# Patient Record
Sex: Female | Born: 1975 | Race: White | Hispanic: No | Smoking: Former smoker
Health system: Southern US, Community
[De-identification: ages and names within clinical notes are randomized; demographics above are authoritative.]

## PROBLEM LIST (undated history)

## (undated) DIAGNOSIS — F1910 Other psychoactive substance abuse, uncomplicated: Secondary | ICD-10-CM

## (undated) DIAGNOSIS — F419 Anxiety disorder, unspecified: Secondary | ICD-10-CM

## (undated) DIAGNOSIS — F32A Depression, unspecified: Secondary | ICD-10-CM

## (undated) DIAGNOSIS — K760 Fatty (change of) liver, not elsewhere classified: Secondary | ICD-10-CM

## (undated) DIAGNOSIS — F329 Major depressive disorder, single episode, unspecified: Secondary | ICD-10-CM

## (undated) DIAGNOSIS — B977 Papillomavirus as the cause of diseases classified elsewhere: Secondary | ICD-10-CM

## (undated) DIAGNOSIS — E7800 Pure hypercholesterolemia, unspecified: Secondary | ICD-10-CM

## (undated) DIAGNOSIS — R0602 Shortness of breath: Secondary | ICD-10-CM

## (undated) DIAGNOSIS — B019 Varicella without complication: Secondary | ICD-10-CM

## (undated) DIAGNOSIS — F1010 Alcohol abuse, uncomplicated: Secondary | ICD-10-CM

## (undated) DIAGNOSIS — K219 Gastro-esophageal reflux disease without esophagitis: Secondary | ICD-10-CM

## (undated) HISTORY — PX: FRACTURE SURGERY: SHX138

## (undated) HISTORY — DX: Fatty (change of) liver, not elsewhere classified: K76.0

## (undated) HISTORY — DX: Other psychoactive substance abuse, uncomplicated: F19.10

## (undated) HISTORY — PX: OTHER SURGICAL HISTORY: SHX169

## (undated) HISTORY — DX: Alcohol abuse, uncomplicated: F10.10

## (undated) HISTORY — DX: Shortness of breath: R06.02

## (undated) HISTORY — PX: CLAVICLE SURGERY: SHX598

## (undated) HISTORY — DX: Anxiety disorder, unspecified: F41.9

## (undated) HISTORY — DX: Depression, unspecified: F32.A

## (undated) HISTORY — PX: GYNECOLOGIC CRYOSURGERY: SHX857

## (undated) HISTORY — DX: Gastro-esophageal reflux disease without esophagitis: K21.9

## (undated) HISTORY — DX: Papillomavirus as the cause of diseases classified elsewhere: B97.7

## (undated) HISTORY — DX: Varicella without complication: B01.9

## (undated) HISTORY — DX: Pure hypercholesterolemia, unspecified: E78.00

---
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
## 2017-01-18 DIAGNOSIS — Z789 Other specified health status: Secondary | ICD-10-CM | POA: Diagnosis not present

## 2017-01-18 DIAGNOSIS — F3181 Bipolar II disorder: Secondary | ICD-10-CM | POA: Diagnosis not present

## 2017-01-19 DIAGNOSIS — N390 Urinary tract infection, site not specified: Secondary | ICD-10-CM | POA: Diagnosis not present

## 2017-02-20 DIAGNOSIS — S99912A Unspecified injury of left ankle, initial encounter: Secondary | ICD-10-CM | POA: Diagnosis not present

## 2017-02-22 DIAGNOSIS — M79672 Pain in left foot: Secondary | ICD-10-CM | POA: Diagnosis not present

## 2017-06-13 DIAGNOSIS — F319 Bipolar disorder, unspecified: Secondary | ICD-10-CM | POA: Diagnosis not present

## 2017-09-20 DIAGNOSIS — M9902 Segmental and somatic dysfunction of thoracic region: Secondary | ICD-10-CM | POA: Diagnosis not present

## 2017-09-20 DIAGNOSIS — M546 Pain in thoracic spine: Secondary | ICD-10-CM | POA: Diagnosis not present

## 2017-09-20 DIAGNOSIS — M542 Cervicalgia: Secondary | ICD-10-CM | POA: Diagnosis not present

## 2017-09-20 DIAGNOSIS — M9901 Segmental and somatic dysfunction of cervical region: Secondary | ICD-10-CM | POA: Diagnosis not present

## 2017-09-22 DIAGNOSIS — M542 Cervicalgia: Secondary | ICD-10-CM | POA: Diagnosis not present

## 2017-09-22 DIAGNOSIS — M546 Pain in thoracic spine: Secondary | ICD-10-CM | POA: Diagnosis not present

## 2017-09-22 DIAGNOSIS — M9901 Segmental and somatic dysfunction of cervical region: Secondary | ICD-10-CM | POA: Diagnosis not present

## 2017-09-22 DIAGNOSIS — M9902 Segmental and somatic dysfunction of thoracic region: Secondary | ICD-10-CM | POA: Diagnosis not present

## 2018-01-03 DIAGNOSIS — F1011 Alcohol abuse, in remission: Secondary | ICD-10-CM | POA: Diagnosis not present

## 2018-01-03 DIAGNOSIS — Z87891 Personal history of nicotine dependence: Secondary | ICD-10-CM | POA: Diagnosis not present

## 2018-01-03 DIAGNOSIS — F1010 Alcohol abuse, uncomplicated: Secondary | ICD-10-CM | POA: Diagnosis not present

## 2018-01-03 DIAGNOSIS — F3181 Bipolar II disorder: Secondary | ICD-10-CM | POA: Diagnosis not present

## 2018-05-23 DIAGNOSIS — F3181 Bipolar II disorder: Secondary | ICD-10-CM | POA: Diagnosis not present

## 2018-05-23 DIAGNOSIS — F1010 Alcohol abuse, uncomplicated: Secondary | ICD-10-CM | POA: Diagnosis not present

## 2018-05-23 DIAGNOSIS — F1011 Alcohol abuse, in remission: Secondary | ICD-10-CM | POA: Diagnosis not present

## 2018-05-23 DIAGNOSIS — Z87891 Personal history of nicotine dependence: Secondary | ICD-10-CM | POA: Diagnosis not present

## 2018-05-23 DIAGNOSIS — Z79899 Other long term (current) drug therapy: Secondary | ICD-10-CM | POA: Diagnosis not present

## 2018-06-14 DIAGNOSIS — E7800 Pure hypercholesterolemia, unspecified: Secondary | ICD-10-CM

## 2018-06-14 HISTORY — DX: Pure hypercholesterolemia, unspecified: E78.00

## 2018-10-27 DIAGNOSIS — F1011 Alcohol abuse, in remission: Secondary | ICD-10-CM | POA: Diagnosis not present

## 2018-10-27 DIAGNOSIS — F3181 Bipolar II disorder: Secondary | ICD-10-CM | POA: Diagnosis not present

## 2018-11-08 ENCOUNTER — Encounter: Payer: Self-pay | Admitting: Obstetrics and Gynecology

## 2018-12-13 ENCOUNTER — Encounter: Payer: Self-pay | Admitting: Obstetrics and Gynecology

## 2018-12-26 ENCOUNTER — Ambulatory Visit: Payer: Federal, State, Local not specified - PPO | Admitting: Obstetrics and Gynecology

## 2018-12-26 ENCOUNTER — Other Ambulatory Visit (HOSPITAL_COMMUNITY)
Admission: RE | Admit: 2018-12-26 | Discharge: 2018-12-26 | Disposition: A | Discharge status: Home / Self Care | Payer: Federal, State, Local not specified - PPO | Source: Ambulatory Visit | Attending: Obstetrics and Gynecology | Admitting: Obstetrics and Gynecology

## 2018-12-26 ENCOUNTER — Encounter: Payer: Self-pay | Admitting: Obstetrics and Gynecology

## 2018-12-26 ENCOUNTER — Other Ambulatory Visit: Payer: Self-pay

## 2018-12-26 VITALS — BP 118/68 | HR 80 | Temp 97.4°F | Resp 14 | Ht 73.0 in | Wt 266.8 lb

## 2018-12-26 DIAGNOSIS — Z23 Encounter for immunization: Secondary | ICD-10-CM

## 2018-12-26 DIAGNOSIS — F419 Anxiety disorder, unspecified: Secondary | ICD-10-CM | POA: Diagnosis not present

## 2018-12-26 DIAGNOSIS — F329 Major depressive disorder, single episode, unspecified: Secondary | ICD-10-CM | POA: Diagnosis not present

## 2018-12-26 DIAGNOSIS — F32A Depression, unspecified: Secondary | ICD-10-CM

## 2018-12-26 DIAGNOSIS — Z01419 Encounter for gynecological examination (general) (routine) without abnormal findings: Secondary | ICD-10-CM

## 2018-12-26 NOTE — Patient Instructions (Signed)

## 2018-12-26 NOTE — Progress Notes (Signed)
43 y.o. G5P0001 Single Caucasian female here as a new patient for an annual exam. Patient moved here from [place], [place]    Has spotting occasionally with her Mirena IUD.   Has a skin lump on her left thigh.   Gained about 40 pounds in the last year.   Lots of transition moving to [place] in the last year. Needs a psychiatrist in town here.    Works for the [employer].  Son is 13 yo.   PCP: Dr. Kittie Plater -- [facility]    Patient's last menstrual period was 10/27/2018 (within weeks).           Sexually active: Yes.    The current method of family planning is Mirena IUD inserted summer 2017.    Exercising: Yes.    walking and yoga Smoker:  Former, quit 2010  Health Maintenance: Pap:  08/05/15 negative History of abnormal Pap:  Yes, hx of Cryosurgery MMG:  03/03/16 BIRADS 1 negative TDaP:  01/04/08 Gardasil:   no HIV: negative in pregnancy Hep C: donated blood Screening Labs: discuss if needed   reports that she quit smoking about 10 years ago. Her smoking use included cigarettes. She has never used smokeless tobacco. She reports previous alcohol use. She reports that she does not use drugs.  Past Medical History:  Diagnosis Date  . Anxiety   . Depression   . HPV in female   . Substance abuse [facility])     Past Surgical History:  Procedure Laterality Date  . CLAVICLE SURGERY    . GYNECOLOGIC CRYOSURGERY      Current Outpatient Medications  Medication Sig Dispense Refill  . DULoxetine (CYMBALTA) 30 MG capsule Take 1 capsule by mouth daily.    Marland Kitchen lamoTRIgine (LAMICTAL) 150 MG tablet Take 150 mg by mouth daily.     Marland Kitchen levonorgestrel (MIRENA) 20 MCG/24HR IUD by Intrauterine route.     No current facility-administered medications for this visit.     Family History  Problem Relation Age of Onset  . Hypertension Mother   . Lupus Maternal Grandmother   . Colon cancer Paternal Grandfather     Review of Systems  Constitutional: Negative.   HENT: Negative.   Eyes:  Negative.   Respiratory: Negative.   Cardiovascular: Negative.   Gastrointestinal: Negative.   Endocrine: Negative.   Genitourinary: Negative.   Musculoskeletal: Negative.   Skin: Negative.   Allergic/Immunologic: Negative.   Neurological: Negative.   Hematological: Negative.   Psychiatric/Behavioral: Negative.     Exam:   BP 118/68 (BP Location: Right Arm, Patient Position: Sitting, Cuff Size: Large)   Pulse 80   Temp (!) 97.4 F (36.3 C) (Temporal)   Resp 14   Ht 6\' 1"  (1.854 m)   Wt 266 lb 12.8 oz (121 kg)   LMP 10/27/2018 (Within Weeks)   BMI 35.20 kg/m     General appearance: alert, cooperative and appears stated age Head: normocephalic, without obvious abnormality, atraumatic Neck: no adenopathy, supple, symmetrical, trachea midline and thyroid normal to inspection and palpation Lungs: clear to auscultation bilaterally Breasts: normal appearance, no masses or tenderness, No nipple retraction or dimpling, No nipple discharge or bleeding, No axillary adenopathy Heart: regular rate and rhythm Abdomen: soft, non-tender; no masses, no organomegaly Extremities: extremities normal, atraumatic, no cyanosis or edema Skin: skin color, texture, turgor normal. No rashes or lesions Lymph nodes: cervical, supraclavicular, and axillary nodes normal. Neurologic: grossly normal  Pelvic: External genitalia:  Sebaceous cyst of the left medial thigh, slight  redness.               No abnormal inguinal nodes palpated.              Urethra:  normal appearing urethra with no masses, tenderness or lesions              Bartholins and Skenes: normal                 Vagina: normal appearing vagina with normal color and discharge, no lesions              Cervix: no lesions.  IUD string noted and are short.               Pap taken: Yes.   Bimanual Exam:  Uterus:  normal size, contour, position, consistency, mobility, non-tender              Adnexa: no mass, fullness, tenderness               Rectal exam: Yes.  .  Confirms.              Anus:  normal sphincter tone, no lesions  Chaperone was present for exam.  Assessment:   Well woman visit with normal exam. Hx cryotherapy.  Mirena IUD.  Depression and anxiety.  Weight gain.   Plan: Mammogram screening discussed.  Information for facilities in [place] to patient.  Self breast awareness reviewed. Pap and HR HPV as above. Guidelines for Calcium, Vitamin D, regular exercise program including cardiovascular and weight bearing exercise. TDap.  Gardasil.  Initiate series.  Routine labs and TSH.  Referral to psychiatry.  Follow up annually and prn.   After visit summary provided.

## 2018-12-27 LAB — COMPREHENSIVE METABOLIC PANEL
ALT: 19 IU/L (ref 0–32)
AST: 17 IU/L (ref 0–40)
Albumin/Globulin Ratio: 1.8 (ref 1.2–2.2)
Albumin: 4.4 g/dL (ref 3.8–4.8)
Alkaline Phosphatase: 104 IU/L (ref 39–117)
BUN/Creatinine Ratio: 14 (ref 9–23)
BUN: 10 mg/dL (ref 6–24)
Bilirubin Total: 1 mg/dL (ref 0.0–1.2)
CO2: 23 mmol/L (ref 20–29)
Calcium: 9.6 mg/dL (ref 8.7–10.2)
Chloride: 101 mmol/L (ref 96–106)
Creatinine, Ser: 0.73 mg/dL (ref 0.57–1.00)
GFR calc Af Amer: 117 mL/min/{1.73_m2} (ref >59)
GFR calc non Af Amer: 101 mL/min/{1.73_m2} (ref >59)
Globulin, Total: 2.5 g/dL (ref 1.5–4.5)
Glucose: 94 mg/dL (ref 65–99)
Potassium: 4.6 mmol/L (ref 3.5–5.2)
Sodium: 138 mmol/L (ref 134–144)
Total Protein: 6.9 g/dL (ref 6.0–8.5)

## 2018-12-27 LAB — CBC
Hematocrit: 38.6 % (ref 34.0–46.6)
Hemoglobin: 13 g/dL (ref 11.1–15.9)
MCH: 29.3 pg (ref 26.6–33.0)
MCHC: 33.7 g/dL (ref 31.5–35.7)
MCV: 87 fL (ref 79–97)
Platelets: 337 10*3/uL (ref 150–450)
RBC: 4.44 x10E6/uL (ref 3.77–5.28)
RDW: 11.7 % (ref 11.7–15.4)
WBC: 8.2 10*3/uL (ref 3.4–10.8)

## 2018-12-27 LAB — LIPID PANEL
Chol/HDL Ratio: 4.6 ratio — ABNORMAL HIGH (ref 0.0–4.4)
Cholesterol, Total: 251 mg/dL — ABNORMAL HIGH (ref 100–199)
HDL: 54 mg/dL (ref >39)
LDL Calculated: 169 mg/dL — ABNORMAL HIGH (ref 0–99)
Triglycerides: 138 mg/dL (ref 0–149)
VLDL Cholesterol Cal: 28 mg/dL (ref 5–40)

## 2018-12-27 LAB — TSH: TSH: 2.41 u[IU]/mL (ref 0.450–4.500)

## 2018-12-28 LAB — CYTOLOGY - PAP
Diagnosis: UNDETERMINED — AB
HPV: DETECTED — AB

## 2018-12-31 ENCOUNTER — Encounter: Payer: Self-pay | Admitting: Obstetrics and Gynecology

## 2019-01-01 ENCOUNTER — Telehealth: Payer: Self-pay

## 2019-01-01 DIAGNOSIS — R87610 Atypical squamous cells of undetermined significance on cytologic smear of cervix (ASC-US): Secondary | ICD-10-CM

## 2019-01-01 NOTE — Telephone Encounter (Signed)
-----   Message from Nunzio Cobbs, MD sent at 12/31/2018  3:57 PM EDT ----- Please contact patient with results.  Her pap is showing ASCUS, and she tested positive for HR HPV.  I am recommending a colposcopy with me.   Your cholesterol is elevated, and I recommend she adopt a diet low in saturated fats and cholesterol, and make an appointment to see a PCP.  Her blood counts, blood chemistries, and thyroid testing are all normal.

## 2019-01-01 NOTE — Telephone Encounter (Signed)
Left message to call Dniya Neuhaus, CMA. °

## 2019-01-02 NOTE — Telephone Encounter (Signed)
Patient is returning a call to Jill. °

## 2019-01-02 NOTE — Telephone Encounter (Signed)
Spoke with patient, advised as seen below per Dr. Quincy Simmonds. Brief explanation of colpo provided, questions answered. IUD for contraceptive, does not have menses. Colpo scheduled for 8/4 at 9:30am with Dr. Quincy Simmonds. Order placed for precert. [id number] precautions reviewed. Patient will return call once she has established care with PCP to request copy of labs.  Patient verbalizes understanding and is agreeable.   Routing to provider for final review. Patient is agreeable to disposition. Will close encounter.  Cc: Magdalene Patricia, [address] [employer]

## 2019-01-02 NOTE — Telephone Encounter (Signed)
Left message to call Jenet Durio, RN at [facility] [phone].   

## 2019-01-02 NOTE — Telephone Encounter (Signed)
Patient returned call

## 2019-01-03 ENCOUNTER — Telehealth: Payer: Self-pay | Admitting: Obstetrics and Gynecology

## 2019-01-03 NOTE — Telephone Encounter (Signed)
Call placed to convey benefits for colposcopy. Spoke with patient she understands/agreeable with benefits. Patient is aware of the cancellation policy. Appointment scheduled 01/16/19.

## 2019-01-12 NOTE — Progress Notes (Signed)
  Subjective:     Patient ID: Kristina Chambers, female   DOB: 1975/07/28, 43 y.o.   MRN: [id number]  HPI   Patient here today for colposcopy with pap smear 12-26-18 showing ASCUS:Pos.HR HPV.  Hx cryotherapy 20 years ago.   Had her first Gardasil.   Review of Systems  Constitutional: Negative.   HENT: Negative.   Eyes: Negative.   Respiratory: Negative.   Cardiovascular: Negative.   Gastrointestinal: Negative.   Endocrine: Negative.   Genitourinary: Negative.   Musculoskeletal: Negative.   Skin: Negative.   Allergic/Immunologic: Negative.   Neurological: Negative.   Hematological: Negative.   Psychiatric/Behavioral: Negative.     LMP: spotting 12-28-2018 Contraception: Mirena IUD 11/2015 UPT: negative      Objective:   Physical Exam Genitourinary:      Colposcopy - cervix, vagina. Consent for procedure.  3% acetic acid used in vagina and cervix White light and green light filter used.  Colposcopy satisfactory:  Yes   __x___          No    _____ Findings:    IUD strings short and present.  Cervix:  Acetowhite change at 3:00. Vagina:  No lesions.  Biopsies:   ECC and biopsy at 3:00 Monsel's placed.  Minimal EBL. No complications.      Assessment:     ASCUS pap and positive HR HPV.  Hx cryotherapy.  Vaginal sidewalls collapsing in during colposcopy.  Mirena IUD with strings short.     Plan:     FU biopsy results.  Instructions and precautions given.  Complete Gardasil tx.  We discussed HPV, abnormal paps, Gardasil, and LEEP.  If needs treatment, will need insulated Graves speculum.

## 2019-01-16 ENCOUNTER — Other Ambulatory Visit: Payer: Self-pay

## 2019-01-16 ENCOUNTER — Ambulatory Visit (INDEPENDENT_AMBULATORY_CARE_PROVIDER_SITE_OTHER): Payer: Federal, State, Local not specified - PPO | Admitting: Obstetrics and Gynecology

## 2019-01-16 ENCOUNTER — Encounter: Payer: Self-pay | Admitting: Obstetrics and Gynecology

## 2019-01-16 VITALS — BP 124/76 | HR 70 | Temp 97.3°F | Resp 14 | Ht 73.0 in | Wt 266.5 lb

## 2019-01-16 DIAGNOSIS — R87610 Atypical squamous cells of undetermined significance on cytologic smear of cervix (ASC-US): Secondary | ICD-10-CM | POA: Diagnosis not present

## 2019-01-16 DIAGNOSIS — Z01812 Encounter for preprocedural laboratory examination: Secondary | ICD-10-CM

## 2019-01-16 DIAGNOSIS — N870 Mild cervical dysplasia: Secondary | ICD-10-CM | POA: Diagnosis not present

## 2019-01-16 DIAGNOSIS — R87810 Cervical high risk human papillomavirus (HPV) DNA test positive: Secondary | ICD-10-CM | POA: Diagnosis not present

## 2019-01-16 LAB — POCT URINE PREGNANCY: Preg Test, Ur: NEGATIVE

## 2019-01-18 ENCOUNTER — Telehealth: Payer: Self-pay

## 2019-01-18 NOTE — Telephone Encounter (Signed)
Spoke with patient and notified of pap results per Dr.Silva's note below. 08 recall entered. AEX 01-23-20

## 2019-01-18 NOTE — Telephone Encounter (Signed)
Left message to call Christerpher Clos, CMA. °

## 2019-01-18 NOTE — Telephone Encounter (Signed)
-----   Message from Nunzio Cobbs, MD sent at 01/18/2019  9:39 AM EDT ----- Please report results of colposcopy showing LGSIL of the cervix.  These are low grade abnormal cells but do not require any treatment.  She needs a pap and HR HPV in one year.  Pap recall - 08.  I am highlighting this result so you know to contact the patient.

## 2019-02-21 ENCOUNTER — Encounter: Payer: Self-pay | Admitting: Adult Health

## 2019-02-21 ENCOUNTER — Other Ambulatory Visit: Payer: Self-pay

## 2019-02-21 ENCOUNTER — Ambulatory Visit (INDEPENDENT_AMBULATORY_CARE_PROVIDER_SITE_OTHER): Payer: Federal, State, Local not specified - PPO | Admitting: Adult Health

## 2019-02-21 DIAGNOSIS — F411 Generalized anxiety disorder: Secondary | ICD-10-CM | POA: Diagnosis not present

## 2019-02-21 DIAGNOSIS — F339 Major depressive disorder, recurrent, unspecified: Secondary | ICD-10-CM

## 2019-02-21 DIAGNOSIS — F331 Major depressive disorder, recurrent, moderate: Secondary | ICD-10-CM

## 2019-02-21 DIAGNOSIS — G4700 Insomnia, unspecified: Secondary | ICD-10-CM | POA: Diagnosis not present

## 2019-02-21 DIAGNOSIS — F329 Major depressive disorder, single episode, unspecified: Secondary | ICD-10-CM | POA: Insufficient documentation

## 2019-02-21 MED ORDER — ARIPIPRAZOLE 5 MG PO TABS: ORAL_TABLET | ORAL | 2 refills | Status: DC

## 2019-02-21 MED ORDER — DULOXETINE HCL 30 MG PO CPEP: 30.0000 mg | ORAL_CAPSULE | Freq: Every day | ORAL | 1 refills | Status: DC

## 2019-02-21 MED ORDER — LAMOTRIGINE 150 MG PO TABS: 150.0000 mg | ORAL_TABLET | Freq: Every day | ORAL | 1 refills | Status: DC

## 2019-02-21 NOTE — Progress Notes (Signed)
[facility] MD/PA/NP Initial Note  02/21/2019 1:59 PM Kristina Chambers  MRN:  [id number]  Chief Complaint:   HPI:   Describes mood today as "not so good". Pleasant. Mood symptoms - reports depression, anxiety, and irritability. More depressed overall. Stating "I'm not doing too good". Symtoms present and worsening over the past year - "even before Covid-19". Has been working from home since Covid-19. Out of her normal work routine - stating "I do better with structure". Has been in the bed for the last week. Was at 100 plus days of sobriety up until a few weeks ago. Has been drinking over the last 2 weeks. Consumes 4 bottles of a wine a day or half of a fifth of ETOH. Last use was yesterday - drank four bottles yesterday. Had suicidal thoughts last weekend and called out to friends, family, and co-workers. Has had the support she needs and is no longer feeling suicidal. Stating "I just want to do what I need to do to get better". Received treatment at an outpatient center in 2018. Has had "periods of doing well", but has declined over the past year. Decreased interest and motivation. Taking medications as prescribed, but does not feel like they are helping. Would like to start talk therapy as soon as possible.  Energy levels stable.  Active, does not have a regular exercise routine. Works full-time from home. 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  Enjoys some usual interests and activities. Spending time with family - 66 year old son. Currently home-schooling son. Has a boyfriend in [place].  Appetite increased. Weight gain 50 pounds over past year. Sleeps better some nights than others. Averages 6 hours of broken sleep. Napping during the day - about 2 hours. Mind racing at night - tries to distract with tv, reading, etc. Stating "I have to do different things to get to sleep". Alcohol gives her 3 hours of sleep.  Focus and concentration difficulties. Completing tasks. Managing some aspects of  household. Stating "my house is either messy or immaculate. Denies SI or HI. Denies AH or VH.  Visit Diagnosis:    ICD-10-CM   1. Generalized anxiety disorder  F41.1 DULoxetine (CYMBALTA) 30 MG capsule    lamoTRIgine (LAMICTAL) 150 MG tablet    ARIPiprazole (ABILIFY) 5 MG tablet  2. Major depressive disorder, recurrent episode, moderate (HCC)  F33.1 DULoxetine (CYMBALTA) 30 MG capsule    lamoTRIgine (LAMICTAL) 150 MG tablet    ARIPiprazole (ABILIFY) 5 MG tablet    Past Psychiatric History: Denies inpatient treatment. Had Outpatient in [place] Fall 2006. Has seen therapist in the past.    Past Medical History:  Past Medical History:  Diagnosis Date  . Anxiety   . Depression   . Elevated cholesterol 2020  . HPV in female   . Substance abuse [facility])     Past Surgical History:  Procedure Laterality Date  . CLAVICLE SURGERY    . GYNECOLOGIC CRYOSURGERY      Family Psychiatric History: Mother - Depression   Family History:  Family History  Problem Relation Age of Onset  . Hypertension Mother   . Lupus Maternal Grandmother   . Colon cancer Paternal Grandfather     Social History:  Social History   Socioeconomic History  . Marital status: Single    Spouse name: Not on file  . Number of children: Not on file  . Years of education: Not on file  . Highest education level: Not on file  Occupational History  .  Not on file  Social Needs  . Financial resource strain: Not on file  . Food insecurity    Worry: Not on file    Inability: Not on file  . Transportation needs    Medical: Not on file    Non-medical: Not on file  Tobacco Use  . Smoking status: Former Smoker    Types: Cigarettes    Quit date: 06/14/2008    Years since quitting: 10.6  . Smokeless tobacco: Never Used  Substance and Sexual Activity  . Alcohol use: Not Currently  . Drug use: Never  . Sexual activity: Yes    Birth control/protection: I.U.D.    Comment: Mirena insertion summer of 2017  Lifestyle   . Physical activity    Days per week: Not on file    Minutes per session: Not on file  . Stress: Not on file  Relationships  . Social Herbalist on phone: Not on file    Gets together: Not on file    Attends religious service: Not on file    Active member of club or organization: Not on file    Attends meetings of clubs or organizations: Not on file    Relationship status: Not on file  Other Topics Concern  . Not on file  Social History Narrative  . Not on file    Allergies: No Known Allergies  Metabolic Disorder Labs: No results found for: HGBA1C, MPG No results found for: PROLACTIN Lab Results  Component Value Date   CHOL 251 (H) 12/26/2018   TRIG 138 12/26/2018   HDL 54 12/26/2018   CHOLHDL 4.6 (H) 12/26/2018   LDLCALC 169 (H) 12/26/2018   Lab Results  Component Value Date   TSH 2.410 12/26/2018    Therapeutic Level Labs: No results found for: LITHIUM No results found for: VALPROATE No components found for:  CBMZ  Current Medications: Current Outpatient Medications  Medication Sig Dispense Refill  . ARIPiprazole (ABILIFY) 5 MG tablet Take 1/2 tablet at bedtime for 3 nights, then one tablet at bedtime. 30 tablet 2  . DULoxetine (CYMBALTA) 30 MG capsule Take 1 capsule (30 mg total) by mouth daily. 90 capsule 1  . lamoTRIgine (LAMICTAL) 100 MG tablet     . lamoTRIgine (LAMICTAL) 150 MG tablet Take 1 tablet (150 mg total) by mouth daily. 90 tablet 1  . levonorgestrel (MIRENA) 20 MCG/24HR IUD by Intrauterine route.     No current facility-administered medications for this visit.     Medication Side Effects: none  Orders placed this visit:  No orders of the defined types were placed in this encounter.   Psychiatric Specialty Exam:  ROS  There were no vitals taken for this visit.There is no height or weight on file to calculate BMI.  General Appearance: UTA  Eye Contact:  UTA  Speech:  Normal Rate  Volume:  Normal  Mood:  Euthymic  Affect:   Appropriate  Thought Process:  Coherent  Orientation:  Full (Time, Place, and Person)  Thought Content: Logical   Suicidal Thoughts:  No  Homicidal Thoughts:  No  Memory:  WNL  Judgement:  Intact  Insight:  Good  Psychomotor Activity:  Normal  Concentration:  Concentration: Good  Recall:  Good  Fund of Knowledge: Good  Language: Good  Assets:  Communication Skills Desire for Improvement Financial Resources/Insurance Housing Intimacy Leisure Time Physical Health Resilience Social Support Talents/Skills Transportation Vocational/Educational  ADL's:  Intact  Cognition: WNL  Prognosis:  Good  Screenings:   Receiving Psychotherapy: No   Treatment Plan/Recommendations:   Plan:  Lamictal 150mg  daily x 2 years Cymbalta 30mg  daily x 2 years  Add - Abilify 5mg  - 1/2 tab at hs  X 3, then one tablet daily.   RTC 3 weeks  Set up with therapy.   Patient advised to contact office with any questions, adverse effects, or acute worsening in signs and symptoms.  Discussed potential metabolic side effects associated with atypical antipsychotics, as well as potential risk for movement side effects. Advised pt to contact office if movement side effects occur.   Counseled patient regarding potential benefits, risks, and side effects of Lamictal to include potential risk of Stevens-Johnson syndrome. Advised patient to stop taking Lamictal and contact office immediately if rash develops and to seek urgent medical attention if rash is severe and/or spreading quickly.    Aloha Gell, NP

## 2019-02-26 ENCOUNTER — Other Ambulatory Visit: Payer: Self-pay

## 2019-02-26 ENCOUNTER — Ambulatory Visit (INDEPENDENT_AMBULATORY_CARE_PROVIDER_SITE_OTHER): Payer: Federal, State, Local not specified - PPO

## 2019-02-26 VITALS — BP 128/74 | HR 112 | Temp 96.4°F | Ht 73.0 in | Wt 271.0 lb

## 2019-02-26 DIAGNOSIS — Z23 Encounter for immunization: Secondary | ICD-10-CM | POA: Diagnosis not present

## 2019-02-26 NOTE — Progress Notes (Signed)
Patient in today for 2nd Gardasil injection.   Contraception: IUD LMP: has some spotting with IUD Last AEX: 12/26/18 with BS  Injection given in LD. Patient tolerated shot well.   Patient informed next injection due in about 4 months.  Advised patient, if not on birth control, to return for next injection with cycle.   Routed to provider for final review.  Encounter closed.

## 2019-03-05 ENCOUNTER — Ambulatory Visit (INDEPENDENT_AMBULATORY_CARE_PROVIDER_SITE_OTHER): Payer: Federal, State, Local not specified - PPO | Admitting: Mental Health

## 2019-03-05 ENCOUNTER — Other Ambulatory Visit: Payer: Self-pay

## 2019-03-05 DIAGNOSIS — F411 Generalized anxiety disorder: Secondary | ICD-10-CM | POA: Diagnosis not present

## 2019-03-05 DIAGNOSIS — F331 Major depressive disorder, recurrent, moderate: Secondary | ICD-10-CM

## 2019-03-05 NOTE — Progress Notes (Signed)
[facility] Counselor Initial Adult Exam  Name: Kristina Chambers Date: 03/05/2019 MRN: [id number] DOB: [birth year] PCP: System, Pcp Not In  Time spent: 42 minutes   Guardian/Payee:  none  Paperwork requested:  none  Reason for Visit /Presenting Problem:  Pt copes w/ depression. Having "ups and downs" for the past year. On August 30th, texted a co-worker about having SI. 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 She stated mgmt at work is supportive. She is the care of Deloria Lair, NP at our practice and reports medication effectiveness. She relocated to this area from [place], [place] for about a year. She stated work is stressful, financial stress. She is a single parent, father of their child is not involved. Her son is age 46. She is currently at diversion investigator for the [employer] She wants to find a balance w/ work and her personal life. Her son is now in a camp and this will help give him needed structure. She is in a relationship w/ a man who lives in [place]; she is trying to mend the relationship due to some past issues. She was sober for about 5 months, then relapsed in August for about a week. Uses to cope w/ depression. She attends AA currently, on the 4th step. grM was an alcohol, mother of pt copes w/ depression.   Mental Status Exam:   Appearance:   Casual     Behavior:  Appropriate  Motor:  Normal  Speech/Language:   Clear and Coherent  Affect:  Appropriate  Mood:  normal  Thought process:  normal  Thought content:    WNL  Sensory/Perceptual disturbances:    WNL  Orientation:  oriented to person, place, time/date and situation  Attention:  Good  Concentration:  Good  Memory:  WNL  Fund of knowledge:   Good  Insight:    Good  Judgment:   Good  Impulse Control:  Good   Reported Symptoms:   Depressed mood (5/10 severity), anxiety, hopeless feelings, low motivation, problems w/ concentration  Risk Assessment: Danger to Self:   passive SI Self-injurious Behavior: No Danger  to Others: No Duty to Warn:no Physical Aggression / Violence:No  Access to Firearms a concern: No  Gang Involvement:No  Patient / guardian was educated about steps to take if suicide or homicide risk level increases between visits: yes While future psychiatric events cannot be accurately predicted, the patient does not currently require acute inpatient psychiatric care and does not currently meet [facility] involuntary commitment criteria.   Medical History/Surgical History:  Past Medical History:  Diagnosis Date  . Anxiety   . Depression   . Elevated cholesterol 2020  . HPV in female   . Substance abuse [facility])     Past Surgical History:  Procedure Laterality Date  . CLAVICLE SURGERY    . GYNECOLOGIC CRYOSURGERY      Medications: Current Outpatient Medications  Medication Sig Dispense Refill  . ARIPiprazole (ABILIFY) 5 MG tablet Take 1/2 tablet at bedtime for 3 nights, then one tablet at bedtime. 30 tablet 2  . DULoxetine (CYMBALTA) 30 MG capsule Take 1 capsule (30 mg total) by mouth daily. 90 capsule 1  . lamoTRIgine (LAMICTAL) 100 MG tablet     . lamoTRIgine (LAMICTAL) 150 MG tablet Take 1 tablet (150 mg total) by mouth daily. 90 tablet 1  . levonorgestrel (MIRENA) 20 MCG/24HR IUD by Intrauterine route.     No current facility-administered medications for this visit.     No Known Allergies  Diagnoses:    ICD-10-CM   1. Major depressive disorder, recurrent episode, moderate (HCC)  F33.1   2. Generalized anxiety disorder  F41.1     Plan of Care: complete part 2 of assessment next session.    Anson [place], [facility]

## 2019-03-12 ENCOUNTER — Ambulatory Visit (INDEPENDENT_AMBULATORY_CARE_PROVIDER_SITE_OTHER): Payer: Federal, State, Local not specified - PPO | Admitting: Adult Health

## 2019-03-12 ENCOUNTER — Encounter: Payer: Self-pay | Admitting: Adult Health

## 2019-03-12 DIAGNOSIS — G4700 Insomnia, unspecified: Secondary | ICD-10-CM

## 2019-03-12 DIAGNOSIS — F331 Major depressive disorder, recurrent, moderate: Secondary | ICD-10-CM

## 2019-03-12 DIAGNOSIS — F1010 Alcohol abuse, uncomplicated: Secondary | ICD-10-CM | POA: Diagnosis not present

## 2019-03-12 DIAGNOSIS — F411 Generalized anxiety disorder: Secondary | ICD-10-CM

## 2019-03-12 MED ORDER — NALTREXONE HCL 50 MG PO TABS: 50.0000 mg | ORAL_TABLET | Freq: Every day | ORAL | 2 refills | Status: DC

## 2019-03-12 NOTE — Progress Notes (Signed)
[facility] MD/PA/NP Initial Note  03/12/2019 3:36 PM Kristina Chambers  MRN:  [id number]  Chief Complaint:   HPI:   Describes mood today as "ok". Pleasant. Mood symptoms - denies depression, anxiety, and irritability. Stating "I'm doing much better". Could tell she was doing better in the first week and a half. Son noticed a difference. Coworkers have also noticed a difference. Denies ETOH use, but having "cravings". Stating "It is worse in the afternoons". Has taken Campral in the past, but had difficulties with compliance. Talking to her sponsor. Going to [employer]. Improved interest and motivation. Taking medications as prescribed. Seeing therapist. Energy levels improved. Active, has a regular exercise routine. Works full-time from home. 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 Enjoys some usual interests and activities. Spending time with family - 11 year old son. Currently home-schooling son. Has a boyfriend in [place].  Appetite adequate. Weight gain 50 pounds over past years.  Sleeps better at night. Has returned to a normal sleeping schedule. Getting up easier in the mornings. Feels alert.  Focus and concentration difficulties. Completing tasks. Managing some aspects of household. Work going well. Feels more productive. Denies SI or HI. Denies AH or VH.  Visit Diagnosis:    ICD-10-CM   1. Major depressive disorder, recurrent episode, moderate (HCC)  F33.1   2. Generalized anxiety disorder  F41.1   3. Insomnia, unspecified type  G47.00   4. Alcohol abuse  F10.10 naltrexone (DEPADE) 50 MG tablet    Past Psychiatric History: Denies inpatient treatment. Had Outpatient in [place] Fall 2006. Has seen therapist in the past.    Past Medical History:  Past Medical History:  Diagnosis Date  . Anxiety   . Depression   . Elevated cholesterol 2020  . HPV in female   . Substance abuse [facility])     Past Surgical History:  Procedure Laterality Date  . CLAVICLE SURGERY    . GYNECOLOGIC  CRYOSURGERY      Family Psychiatric History: Mother - Depression   Family History:  Family History  Problem Relation Age of Onset  . Hypertension Mother   . Lupus Maternal Grandmother   . Colon cancer Paternal Grandfather     Social History:  Social History   Socioeconomic History  . Marital status: Single    Spouse name: Not on file  . Number of children: Not on file  . Years of education: Not on file  . Highest education level: Not on file  Occupational History  . Not on file  Social Needs  . Financial resource strain: Not on file  . Food insecurity    Worry: Not on file    Inability: Not on file  . Transportation needs    Medical: Not on file    Non-medical: Not on file  Tobacco Use  . Smoking status: Former Smoker    Types: Cigarettes    Quit date: 06/14/2008    Years since quitting: 10.7  . Smokeless tobacco: Never Used  Substance and Sexual Activity  . Alcohol use: Not Currently  . Drug use: Never  . Sexual activity: Yes    Birth control/protection: I.U.D.    Comment: Mirena insertion summer of 2017  Lifestyle  . Physical activity    Days per week: Not on file    Minutes per session: Not on file  . Stress: Not on file  Relationships  . Social Herbalist on phone: Not on file    Gets together:  Not on file    Attends religious service: Not on file    Active member of club or organization: Not on file    Attends meetings of clubs or organizations: Not on file    Relationship status: Not on file  Other Topics Concern  . Not on file  Social History Narrative  . Not on file    Allergies: No Known Allergies  Metabolic Disorder Labs: No results found for: HGBA1C, MPG No results found for: PROLACTIN Lab Results  Component Value Date   CHOL 251 (H) 12/26/2018   TRIG 138 12/26/2018   HDL 54 12/26/2018   CHOLHDL 4.6 (H) 12/26/2018   LDLCALC 169 (H) 12/26/2018   Lab Results  Component Value Date   TSH 2.410 12/26/2018    Therapeutic  Level Labs: No results found for: LITHIUM No results found for: VALPROATE No components found for:  CBMZ  Current Medications: Current Outpatient Medications  Medication Sig Dispense Refill  . ARIPiprazole (ABILIFY) 5 MG tablet Take 1/2 tablet at bedtime for 3 nights, then one tablet at bedtime. 30 tablet 2  . DULoxetine (CYMBALTA) 30 MG capsule Take 1 capsule (30 mg total) by mouth daily. 90 capsule 1  . lamoTRIgine (LAMICTAL) 100 MG tablet     . lamoTRIgine (LAMICTAL) 150 MG tablet Take 1 tablet (150 mg total) by mouth daily. 90 tablet 1  . levonorgestrel (MIRENA) 20 MCG/24HR IUD by Intrauterine route.    . naltrexone (DEPADE) 50 MG tablet Take 1 tablet (50 mg total) by mouth daily. 30 tablet 2   No current facility-administered medications for this visit.     Medication Side Effects: none  Orders placed this visit:  No orders of the defined types were placed in this encounter.   Psychiatric Specialty Exam:  ROS  There were no vitals taken for this visit.There is no height or weight on file to calculate BMI.  General Appearance: UTA  Eye Contact:  UTA  Speech:  Normal Rate  Volume:  Normal  Mood:  Euthymic  Affect:  UTA  Thought Process:  Coherent  Orientation:  Full (Time, Place, and Person)  Thought Content: Logical   Suicidal Thoughts:  No  Homicidal Thoughts:  No  Memory:  WNL  Judgement:  Intact  Insight:  Good  Psychomotor Activity:  Normal  Concentration:  Concentration: Good  Recall:  Good  Fund of Knowledge: Good  Language: Good  Assets:  Communication Skills Desire for Improvement Financial Resources/Insurance Housing Intimacy Leisure Time Physical Health Resilience Social Support Talents/Skills Transportation Vocational/Educational  ADL's:  Intact  Cognition: WNL  Prognosis:  Good   Screenings:   Receiving Psychotherapy: No   Treatment Plan/Recommendations:   Plan:  Continue: Lamictal 150mg  daily  Cymbalta 30mg  daily  Abilify 5mg   daily Add Naltrexone 50mg  daily for ETOH cravings  RTC 4 weeks  Set up with therapy.   Patient advised to contact office with any questions, adverse effects, or acute worsening in signs and symptoms.  Discussed potential metabolic side effects associated with atypical antipsychotics, as well as potential risk for movement side effects. Advised pt to contact office if movement side effects occur.   Counseled patient regarding potential benefits, risks, and side effects of Lamictal to include potential risk of Stevens-Johnson syndrome. Advised patient to stop taking Lamictal and contact office immediately if rash develops and to seek urgent medical attention if rash is severe and/or spreading quickly.    Aloha Gell, NP

## 2019-03-13 ENCOUNTER — Other Ambulatory Visit: Payer: Self-pay | Admitting: Adult Health

## 2019-03-13 DIAGNOSIS — F1010 Alcohol abuse, uncomplicated: Secondary | ICD-10-CM

## 2019-03-13 MED ORDER — NALTREXONE HCL 50 MG PO TABS: 50.0000 mg | ORAL_TABLET | Freq: Every day | ORAL | 2 refills | Status: DC

## 2019-03-14 ENCOUNTER — Other Ambulatory Visit: Payer: Self-pay

## 2019-03-14 ENCOUNTER — Ambulatory Visit (INDEPENDENT_AMBULATORY_CARE_PROVIDER_SITE_OTHER): Payer: Federal, State, Local not specified - PPO | Admitting: Mental Health

## 2019-03-14 DIAGNOSIS — F331 Major depressive disorder, recurrent, moderate: Secondary | ICD-10-CM | POA: Diagnosis not present
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
## 2019-03-16 ENCOUNTER — Other Ambulatory Visit: Payer: Self-pay | Admitting: Adult Health

## 2019-03-16 DIAGNOSIS — F331 Major depressive disorder, recurrent, moderate: Secondary | ICD-10-CM

## 2019-03-16 DIAGNOSIS — F411 Generalized anxiety disorder: Secondary | ICD-10-CM

## 2019-03-21 ENCOUNTER — Other Ambulatory Visit: Payer: Self-pay

## 2019-03-21 ENCOUNTER — Ambulatory Visit (INDEPENDENT_AMBULATORY_CARE_PROVIDER_SITE_OTHER): Payer: Federal, State, Local not specified - PPO | Admitting: Mental Health

## 2019-03-21 DIAGNOSIS — F331 Major depressive disorder, recurrent, moderate: Secondary | ICD-10-CM

## 2019-03-21 NOTE — Progress Notes (Signed)
[facility] Counselor Psychotherapy Note   Name: Kristina Chambers Date: 03/21/19 MRN: [id number] DOB: 1975/09/11 PCP: System, Pcp Not In  Time spent: 46 minutes   Treatment: Individual therapy  Mental Status Exam:   Appearance:   Casual     Behavior:  Appropriate  Motor:  Normal  Speech/Language:   Clear and Coherent  Affect:  Appropriate  Mood:  normal  Thought process:  normal  Thought content:    WNL  Sensory/Perceptual disturbances:    WNL  Orientation:  oriented to person, place, time/date and situation  Attention:  Good  Concentration:  Good  Memory:  WNL  Fund of knowledge:   Good  Insight:    Good  Judgment:   Good  Impulse Control:  Good   Reported Symptoms:   Depressed mood (5/10 severity), anxiety, hopeless feelings, low motivation, problems w/ concentration  Risk Assessment: Danger to Self:   passive SI Self-injurious Behavior: No Danger to Others: No Duty to Warn:no Physical Aggression / Violence:No  Access to Firearms a concern: No  Gang Involvement:No  Patient / guardian was educated about steps to take if suicide or homicide risk level increases between visits: yes While future psychiatric events cannot be accurately predicted, the patient does not currently require acute inpatient psychiatric care and does not currently meet [facility] involuntary commitment criteria.   Subjective: Patient arrived on time for today's session in mild distress.  She shared how she is felt more depressed over the last few days going on to provide details.  She shared how she made efforts on 2 occasions to engage in walking exercise while also stating that she feels she could have done more and she knows it will be helpful.  She shared how she is staying busy after work spending time with friends when possible.  She states she has been very busy with work and balancing that with being a single mother to her 11-year-old son was discussed.  She shared how he continues to attend  school and part online due to the viral pandemic.  She shared the challenges of ensuring that he understands educational concepts while also keeping up with her full-time job.  She is considering moving state to be with her current boyfriend possibly in the next several months.  Patient has time to make this decision and to give herself enough time to make this decision carefully was encouraged and she also enjoys living in this.  In part due to her parents living locally.  Encouraged journaling between sessions discussing specifics.  She plans to follow through.  She reports maintaining her sobriety at this time and continues to attend AA meetings.  Interventions:  Assmt, CBT, coping skills identified  Diagnoses:    ICD-10-CM   1. Major depressive disorder, recurrent episode, moderate (HCC)  F33.1      Individualized Plan of Care:  1. Patient to engage psychiatric evaluation and follow medication regimen.  2. Patient to engage in individual psychotherapy 2-4x/month or as needed.  3. Patient to identify and apply coping skills learned in session to decrease depression symptoms evidenced by engaging in pleasurable activities such as exercising and per self report.   4. Patient to learn and apply CBT, mindfulness skills and strategies learned in session.  5. Patient to contact this office, go to the local ED or call 911 if a crisis or emergency develops between visits.    Anson [place], [facility]

## 2019-03-28 ENCOUNTER — Other Ambulatory Visit: Payer: Self-pay

## 2019-03-28 ENCOUNTER — Ambulatory Visit (INDEPENDENT_AMBULATORY_CARE_PROVIDER_SITE_OTHER): Payer: Federal, State, Local not specified - PPO | Admitting: Mental Health

## 2019-03-28 DIAGNOSIS — F331 Major depressive disorder, recurrent, moderate: Secondary | ICD-10-CM
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
## 2019-04-04 ENCOUNTER — Ambulatory Visit (INDEPENDENT_AMBULATORY_CARE_PROVIDER_SITE_OTHER): Payer: Federal, State, Local not specified - PPO | Admitting: Mental Health

## 2019-04-04 ENCOUNTER — Other Ambulatory Visit: Payer: Self-pay

## 2019-04-04 DIAGNOSIS — F331 Major depressive disorder, recurrent, moderate: Secondary | ICD-10-CM | POA: Diagnosis not present
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
## 2019-04-11 ENCOUNTER — Ambulatory Visit: Payer: Federal, State, Local not specified - PPO | Admitting: Mental Health

## 2019-04-19 ENCOUNTER — Ambulatory Visit: Payer: Federal, State, Local not specified - PPO | Admitting: Mental Health

## 2019-04-26 ENCOUNTER — Ambulatory Visit: Payer: Federal, State, Local not specified - PPO | Admitting: Mental Health

## 2019-05-03 ENCOUNTER — Ambulatory Visit (INDEPENDENT_AMBULATORY_CARE_PROVIDER_SITE_OTHER): Payer: Federal, State, Local not specified - PPO | Admitting: Mental Health

## 2019-05-03 ENCOUNTER — Other Ambulatory Visit: Payer: Self-pay

## 2019-05-03 DIAGNOSIS — F331 Major depressive disorder, recurrent, moderate: Secondary | ICD-10-CM

## 2019-05-03 NOTE — Progress Notes (Addendum)
[facility] Psychotherapy Note   Name: Kristina Chambers Date: 05/03/19 MRN: [id number] DOB: 19-Sep-1975 PCP: System, Pcp Not In  Time spent: 51 minutes   Treatment: Individual therapy  Mental Status Exam:   Appearance:   Casual     Behavior:  Appropriate  Motor:  Normal  Speech/Language:   Clear and Coherent  Affect:  Appropriate  Mood:  normal  Thought process:  normal  Thought content:    WNL  Sensory/Perceptual disturbances:    WNL  Orientation:  oriented to person, place, time/date and situation  Attention:  Good  Concentration:  Good  Memory:  WNL  Fund of knowledge:   Good  Insight:    Good  Judgment:   Good  Impulse Control:  Good   Reported Symptoms:   Depressed mood (5/10 severity), anxiety, hopeless feelings, low motivation, problems w/ concentration  Risk Assessment: Danger to Self:  denies Self-injurious Behavior: No Danger to Others: No Duty to Warn:no Physical Aggression / Violence:No  Access to Firearms a concern: No  Gang Involvement:No  Patient / guardian was educated about steps to take if suicide or homicide risk level increases between visits: yes While future psychiatric events cannot be accurately predicted, the patient does not currently require acute inpatient psychiatric care and does not currently meet [facility] involuntary commitment criteria.   Subjective: Patient arrived on time for today's session.  She shared progress since her last visit.  She stated that she has a new vehicle and stress around having her old car and possibly breaking down day-to-day is gone.  This is a relief and she has endured this for the last few months until recently.  She shared how her boyfriend has been highly supportive along with her parents.  She plans to pay her parents back for a small loan received and her boyfriend has provided significant financial support.  Her financial stresses lowered significantly.  She continues to have some relational stress  due to the uncertainty of when she and her boyfriend can live in the same city which is their current goal.  She recently learned of the upsetting news that she will be unable to transfer out of state as she tentatively planned.  Assisted her in identifying needs.  She ultimately would like to stay in this area due to her parents being nearby as a support to her and her son, to have a close relationship with them specifically with that of her son.  Today, she identified the need to have more detailed discussion with her boyfriend about the possibility of him moving to this area.  Also, where he sees their relationship going in terms of marriage.  Patient was encouraged to recognize being assertive with some of her communication to further understand his expectations close needed and she plans to take steps between sessions.  To decrease rumination we discussed thought stopping to utilize between sessions.  Patient plans to follow through.   Interventions:  Assmt, CBT, coping skills identified  Diagnoses:    ICD-10-CM   1. Major depressive disorder, recurrent episode, moderate ([place])  F33.1       Plan: Patient is to use CBT, mindfulness and coping skills to help manage decrease her symptoms.  Patient will work to increase her consistency with her exercise regimen as she has struggled with this over the last few weeks.  Patient will continue to utilize her coping skills to maintain her sobriety.   Long-term goal:  Reduce overall level, frequency, and intensity of  the feelings of depression from 5/10 in severity to 0-1 in severity for a consecutive month at minimum.  Pt to decrease anxiety and depression so that daily functioning is not impaired. Short-term goal: To identify and process feelings related to the disappointment of past painful events that affect her self worth.                 Verbally express understanding of the relationship between depressed mood and repression of feelings - such as  anger, hurt, and sadness.                            Verbalize an understanding of the role that distorted thinking plays in creating fears, excessive worry, and ruminations.  Anson [place], [facility]

## 2019-05-18 ENCOUNTER — Other Ambulatory Visit: Payer: Self-pay

## 2019-05-18 ENCOUNTER — Ambulatory Visit (INDEPENDENT_AMBULATORY_CARE_PROVIDER_SITE_OTHER): Payer: Federal, State, Local not specified - PPO | Admitting: Mental Health

## 2019-05-18 DIAGNOSIS — F331 Major depressive disorder, recurrent, moderate: Secondary | ICD-10-CM

## 2019-05-31 ENCOUNTER — Ambulatory Visit: Payer: Federal, State, Local not specified - PPO | Admitting: Mental Health

## 2019-05-31 NOTE — Progress Notes (Signed)
[facility] Psychotherapy Note   Name: Kristina Chambers Date: 05/18/19 MRN: [id number] DOB: 05-Apr-1976 PCP: System, Pcp Not In  Time spent: 55 minutes   Treatment: Individual therapy  Mental Status Exam:   Appearance:   Casual     Behavior:  Appropriate  Motor:  Normal  Speech/Language:   Clear and Coherent  Affect:  Appropriate  Mood:  normal  Thought process:  normal  Thought content:    WNL  Sensory/Perceptual disturbances:    WNL  Orientation:  oriented to person, place, time/date and situation  Attention:  Good  Concentration:  Good  Memory:  WNL  Fund of knowledge:   Good  Insight:    Good  Judgment:   Good  Impulse Control:  Good   Reported Symptoms:   Depressed mood (5/10 severity), anxiety, hopeless feelings, low motivation, problems w/ concentration  Risk Assessment: Danger to Self:  denies Self-injurious Behavior: No Danger to Others: No Duty to Warn:no Physical Aggression / Violence:No  Access to Firearms a concern: No  Gang Involvement:No  Patient / guardian was educated about steps to take if suicide or homicide risk level increases between visits: yes While future psychiatric events cannot be accurately predicted, the patient does not currently require acute inpatient psychiatric care and does not currently meet [facility] involuntary commitment criteria.   Subjective: Patient arrived for today's session on time, no distress.  Continues to have less financial stress.  Parents have been supportive in her boyfriend has been supportive as well financially.  She shared how he is helping her pay some monthly bills and this is really helping her get ahead.  She continues to want to live in the same area with him going to express possible plans to this end.  She identified to discovery, her need for support for her son being near her parents where she would rather have him moved to this area, at least temporarily for the next few years.  She stated that she  has difficulty transferring due to her eligibility and he has not transferred to the city in which he would like to live which is out of state.  She shared ways she plans to possibly talk with him about this further, some anxiety as she knows he ultimately would like to live in his home city out of state.  Some ways to communicate these thoughts and feelings were explored with patient.  Interventions:  CBT, coping skills identified, problem solving/solution focused strategies  Diagnoses:    ICD-10-CM   1. Major depressive disorder, recurrent episode, moderate ([place])  F33.1     Plan: Patient is to use CBT, mindfulness and coping skills to help manage decrease her symptoms.  Patient will work to increase her consistency with her exercise regimen as she has struggled with this over the last few weeks.  Patient will maintain sobriety utilizing coping skills.  Patient will take steps toward increased honest communication with her boyfriend.   Long-term goal:  Reduce overall level, frequency, and intensity of the feelings of depression from 5/10 in severity to 0-1 in severity for a consecutive month at minimum.  Pt to decrease anxiety and depression so that daily functioning is not impaired. Short-term goal: To identify and process feelings related to the disappointment of past painful events that affect her self worth.                 Verbally express understanding of the relationship between depressed mood and repression of feelings -  such as anger, hurt, and sadness.                            Verbalize an understanding of the role that distorted thinking plays in creating fears, excessive worry, and ruminations.  Progress: Progressing  Anson [place], [facility]

## 2019-06-05 ENCOUNTER — Ambulatory Visit: Payer: Federal, State, Local not specified - PPO | Admitting: Mental Health

## 2019-06-14 ENCOUNTER — Other Ambulatory Visit: Payer: Self-pay

## 2019-06-14 ENCOUNTER — Ambulatory Visit (INDEPENDENT_AMBULATORY_CARE_PROVIDER_SITE_OTHER): Payer: Federal, State, Local not specified - PPO | Admitting: Adult Health

## 2019-06-14 ENCOUNTER — Encounter: Payer: Self-pay | Admitting: Adult Health

## 2019-06-14 DIAGNOSIS — F411 Generalized anxiety disorder: Secondary | ICD-10-CM

## 2019-06-14 DIAGNOSIS — F331 Major depressive disorder, recurrent, moderate: Secondary | ICD-10-CM

## 2019-06-14 DIAGNOSIS — F1010 Alcohol abuse, uncomplicated: Secondary | ICD-10-CM | POA: Diagnosis not present

## 2019-06-14 MED ORDER — NALTREXONE HCL 50 MG PO TABS: 50.0000 mg | ORAL_TABLET | Freq: Every day | ORAL | 5 refills | Status: DC

## 2019-06-14 MED ORDER — DULOXETINE HCL 30 MG PO CPEP: 30.0000 mg | ORAL_CAPSULE | Freq: Every day | ORAL | 1 refills | Status: DC

## 2019-06-14 MED ORDER — ARIPIPRAZOLE 10 MG PO TABS: ORAL_TABLET | ORAL | 1 refills | Status: DC

## 2019-06-14 MED ORDER — LAMOTRIGINE 150 MG PO TABS: 150.0000 mg | ORAL_TABLET | Freq: Every day | ORAL | 1 refills | Status: DC

## 2019-06-14 NOTE — Progress Notes (Signed)
Kristina Chambers [id number] 11-15-1975 43 y.o.  Subjective:   Patient ID:  Kristina Chambers is a 44 y.o. (DOB 08-Nov-1975) female.  Chief Complaint: No chief complaint on file.   HPI Kristina Chambers presents to the office today for follow-up of   Describes mood today as "ok". Pleasant. Mood symptoms - reports depression, anxiety, and irritability. Stating "I'm not doing as well as I was". Was doing good back in September and October, but then mood started to decline. Stating "everything started taking more effort". Having to push herself to get things done. Wanting to stay in bed more. Mood has gotten worse over the past 3 weeks. Has maintained sobriety for 5 months with Naltrexone. 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 Decreased interest and motivation. Taking medications as prescribed. Seeing therapist. Energy levels decreased. Active, has a regular exercise routine. Works full-time from home. 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 Enjoys some usual interests and activities. Lives with 33 year old son. Has a boyfriend in [place] - visiting over holidays.  Appetite adequate. Weight stable.  Sleeps wells most nights. Averages to 6 to 8 hours.  Focus and concentration difficulties. Completing tasks. Managing some aspects of household. Not feeling as productive in the work setting. Denies SI or HI. Denies AH or VH.  Review of Systems:  Review of Systems  Musculoskeletal: Negative for gait problem.  Neurological: Negative for tremors.  Psychiatric/Behavioral:       Please refer to HPI    Medications: I have reviewed the patient's current medications.  Current Outpatient Medications  Medication Sig Dispense Refill  . ARIPiprazole (ABILIFY) 10 MG tablet Take one tablet daily. 90 tablet 1  . DULoxetine (CYMBALTA) 30 MG capsule Take 1 capsule (30 mg total) by mouth daily. 90 capsule 1  . lamoTRIgine (LAMICTAL) 150 MG tablet Take 1 tablet (150 mg total) by mouth daily. 90 tablet 1  .  levonorgestrel (MIRENA) 20 MCG/24HR IUD by Intrauterine route.    . naltrexone (DEPADE) 50 MG tablet Take 1 tablet (50 mg total) by mouth daily. 30 tablet 5   No current facility-administered medications for this visit.    Medication Side Effects: None  Allergies: No Known Allergies  Past Medical History:  Diagnosis Date  . Anxiety   . Depression   . Elevated cholesterol 2020  . HPV in female   . Substance abuse ([place])     Family History  Problem Relation Age of Onset  . Hypertension Mother   . Lupus Maternal Grandmother   . Colon cancer Paternal Grandfather     Social History   Socioeconomic History  . Marital status: Single    Spouse name: Not on file  . Number of children: Not on file  . Years of education: Not on file  . Highest education level: Not on file  Occupational History  . Not on file  Tobacco Use  . Smoking status: Former Smoker    Types: Cigarettes    Quit date: 06/14/2008    Years since quitting: 11.0  . Smokeless tobacco: Never Used  Substance and Sexual Activity  . Alcohol use: Not Currently  . Drug use: Never  . Sexual activity: Yes    Birth control/protection: I.U.D.    Comment: Mirena insertion summer of 2017  Other Topics Concern  . Not on file  Social History Narrative  . Not on file   Social Determinants of Health   Financial Resource Strain:   . Difficulty  of Paying Living Expenses: Not on file  Food Insecurity:   . Worried About Charity fundraiser in the Last Year: Not on file  . Ran Out of Food in the Last Year: Not on file  Transportation Needs:   . Lack of Transportation (Medical): Not on file  . Lack of Transportation (Non-Medical): Not on file  Physical Activity:   . Days of Exercise per Week: Not on file  . Minutes of Exercise per Session: Not on file  Stress:   . Feeling of Stress : Not on file  Social Connections:   . Frequency of Communication with Friends and Family: Not on file  . Frequency of Social Gatherings  with Friends and Family: Not on file  . Attends Religious Services: Not on file  . Active Member of Clubs or Organizations: Not on file  . Attends Archivist Meetings: Not on file  . Marital Status: Not on file  Intimate Partner Violence:   . Fear of Current or Ex-Partner: Not on file  . Emotionally Abused: Not on file  . Physically Abused: Not on file  . Sexually Abused: Not on file    Past Medical History, Surgical history, Social history, and Family history were reviewed and updated as appropriate.   Please see review of systems for further details on the patient's review from today.   Objective:   Physical Exam:  There were no vitals taken for this visit.  Physical Exam Constitutional:      General: She is not in acute distress.    Appearance: She is well-developed.  Musculoskeletal:        General: No deformity.  Neurological:     Mental Status: She is alert and oriented to person, place, and time.     Coordination: Coordination normal.  Psychiatric:        Attention and Perception: Attention and perception normal. She does not perceive auditory or visual hallucinations.        Mood and Affect: Mood normal. Mood is not anxious or depressed. Affect is not labile, blunt, angry or inappropriate.        Speech: Speech normal.        Behavior: Behavior normal.        Thought Content: Thought content normal. Thought content is not paranoid or delusional. Thought content does not include homicidal or suicidal ideation. Thought content does not include homicidal or suicidal plan.        Cognition and Memory: Cognition and memory normal.        Judgment: Judgment normal.     Comments: Insight intact     Lab Review:     Component Value Date/Time   NA 138 12/26/2018 1144   K 4.6 12/26/2018 1144   CL 101 12/26/2018 1144   CO2 23 12/26/2018 1144   GLUCOSE 94 12/26/2018 1144   BUN 10 12/26/2018 1144   CREATININE 0.73 12/26/2018 1144   CALCIUM 9.6 12/26/2018 1144    PROT 6.9 12/26/2018 1144   ALBUMIN 4.4 12/26/2018 1144   AST 17 12/26/2018 1144   ALT 19 12/26/2018 1144   ALKPHOS 104 12/26/2018 1144   BILITOT 1.0 12/26/2018 1144   GFRNONAA 101 12/26/2018 1144   GFRAA 117 12/26/2018 1144       Component Value Date/Time   WBC 8.2 12/26/2018 1144   RBC 4.44 12/26/2018 1144   HGB 13.0 12/26/2018 1144   HCT 38.6 12/26/2018 1144   PLT 337 12/26/2018 1144   MCV  87 12/26/2018 1144   MCH 29.3 12/26/2018 1144   MCHC 33.7 12/26/2018 1144   RDW 11.7 12/26/2018 1144    No results found for: POCLITH, LITHIUM   No results found for: PHENYTOIN, PHENOBARB, VALPROATE, CBMZ   .res Assessment: Plan:    Plan:  Continue: Lamictal 150mg  daily  Cymbalta 30mg  daily  Increase Abilify 5mg  to 10mg  daily to target depression Naltrexone 50mg  daily for ETOH cravings - 5 months sober   Uses Melatonin some nights  RTC 4 weeks  Patient advised to contact office with any questions, adverse effects, or acute worsening in signs and symptoms.  Discussed potential metabolic side effects associated with atypical antipsychotics, as well as potential risk for movement side effects. Advised pt to contact office if movement side effects occur.   Counseled patient regarding potential benefits, risks, and side effects of Lamictal to include potential risk of Stevens-Johnson syndrome. Advised patient to stop taking Lamictal and contact office immediately if rash develops and to seek urgent medical attention if rash is severe and/or spreading quickly.   Diagnoses and all orders for this visit:  Alcohol abuse -     naltrexone (DEPADE) 50 MG tablet; Take 1 tablet (50 mg total) by mouth daily.  Generalized anxiety disorder -     ARIPiprazole (ABILIFY) 10 MG tablet; Take one tablet daily. -     lamoTRIgine (LAMICTAL) 150 MG tablet; Take 1 tablet (150 mg total) by mouth daily. -     DULoxetine (CYMBALTA) 30 MG capsule; Take 1 capsule (30 mg total) by mouth daily.  Major  depressive disorder, recurrent episode, moderate ([facility]) -     ARIPiprazole (ABILIFY) 10 MG tablet; Take one tablet daily. -     lamoTRIgine (LAMICTAL) 150 MG tablet; Take 1 tablet (150 mg total) by mouth daily. -     DULoxetine (CYMBALTA) 30 MG capsule; Take 1 capsule (30 mg total) by mouth daily.     Please see After Visit Summary for patient specific instructions.  Future Appointments  Date Time Provider [place]  06/20/2019  9:00 AM Anson [place], [facility] CP-CP None  07/12/2019 10:20 AM Doye Montilla, Berdie Ogren, NP CP-CP None  01/23/2020 11:30 AM Nunzio Cobbs, MD [place] None    No orders of the defined types were placed in this encounter.   -------------------------------

## 2019-06-15 DIAGNOSIS — L900 Lichen sclerosus et atrophicus: Secondary | ICD-10-CM

## 2019-06-15 DIAGNOSIS — D069 Carcinoma in situ of cervix, unspecified: Secondary | ICD-10-CM

## 2019-06-15 HISTORY — DX: Carcinoma in situ of cervix, unspecified: D06.9

## 2019-06-15 HISTORY — DX: Lichen sclerosus et atrophicus: L90.0

## 2019-06-20 ENCOUNTER — Other Ambulatory Visit: Payer: Self-pay

## 2019-06-20 ENCOUNTER — Ambulatory Visit (INDEPENDENT_AMBULATORY_CARE_PROVIDER_SITE_OTHER): Payer: Federal, State, Local not specified - PPO | Admitting: Mental Health

## 2019-06-20 DIAGNOSIS — F331 Major depressive disorder, recurrent, moderate: Secondary | ICD-10-CM

## 2019-06-20 DIAGNOSIS — F411 Generalized anxiety disorder: Secondary | ICD-10-CM

## 2019-06-21 ENCOUNTER — Other Ambulatory Visit: Payer: Self-pay | Admitting: Adult Health

## 2019-06-21 DIAGNOSIS — F331 Major depressive disorder, recurrent, moderate: Secondary | ICD-10-CM

## 2019-06-21 DIAGNOSIS — F411 Generalized anxiety disorder: Secondary | ICD-10-CM

## 2019-07-02 NOTE — Progress Notes (Signed)
[facility] Psychotherapy Note   Name: Kristina Chambers Date: 06/20/19 MRN: [id number] DOB: 06-26-1975 PCP: System, Pcp Not In  Time spent: 50 minutes   Treatment: Individual therapy  Mental Status Exam:   Appearance:   Casual     Behavior:  Appropriate  Motor:  Normal  Speech/Language:   Clear and Coherent  Affect:  Appropriate  Mood:  normal  Thought process:  normal  Thought content:    WNL  Sensory/Perceptual disturbances:    WNL  Orientation:  oriented to person, place, time/date and situation  Attention:  Good  Concentration:  Good  Memory:  WNL  Fund of knowledge:   Good  Insight:    Good  Judgment:   Good  Impulse Control:  Good   Reported Symptoms:   Depressed mood (5/10 severity), anxiety, hopeless feelings, low motivation, problems w/ concentration  Risk Assessment: Danger to Self:  denies Self-injurious Behavior: No Danger to Others: No Duty to Warn:no Physical Aggression / Violence:No  Access to Firearms a concern: No  Gang Involvement:No  Patient / guardian was educated about steps to take if suicide or homicide risk level increases between visits: yes While future psychiatric events cannot be accurately predicted, the patient does not currently require acute inpatient psychiatric care and does not currently meet [facility] involuntary commitment criteria.   Subjective: Patient arrived on time for today's session.  She stated that her boyfriend visited recently and she shared experiences.  She stated they both discussed on several occasions the possibility of his moving to this area.  She stated that he appears motivated and this plays patient greatly as this is where her parents live and she wants her son to be around them as much as possible while younger.  She stated that her boyfriend returned home out of state and appears to have possibly rethought about moving to this area as he is talking more about moving back to his home town.  Ways to further  explore with her boyfriend and her concerns was discussed as she stated that she has not brought up the issue.  She stated that she continues to cope with some levels of depression where she has low motivation but the relationship not being a main stressor or catalyst toward her depression.  We reviewed some strategies related to taking initial steps to start behavior such as doing her morning walks and engaging in some events when she felt appropriate with friends.  Interventions:  CBT, coping skills identified, problem solving/solution focused strategies  Diagnoses:    ICD-10-CM   1. Generalized anxiety disorder  F41.1   2. Major depressive disorder, recurrent episode, moderate ([place])  F33.1     Plan: Patient is to use CBT, mindfulness and coping skills to help manage decrease her symptoms.  Patient will work to increase her consistency with her exercise regimen as she has struggled with this over the last few weeks.  Patient will maintain sobriety utilizing coping skills.  Patient will take steps toward increased honest communication with her boyfriend.   Long-term goal:  Reduce overall level, frequency, and intensity of the feelings of depression from 5/10 in severity to 0-1 in severity for a consecutive month at minimum.  Pt to decrease anxiety and depression so that daily functioning is not impaired. Short-term goal: To identify and process feelings related to the disappointment of past painful events that affect her self worth.  Verbally express understanding of the relationship between depressed mood and repression of feelings - such as anger, hurt, and sadness.                            Verbalize an understanding of the role that distorted thinking plays in creating fears, excessive worry, and ruminations.  Progress: Progressing  Anson [place], [facility]

## 2019-07-05 ENCOUNTER — Ambulatory Visit: Payer: Federal, State, Local not specified - PPO | Admitting: Mental Health

## 2019-07-10 ENCOUNTER — Ambulatory Visit: Payer: Federal, State, Local not specified - PPO | Admitting: Mental Health

## 2019-07-12 ENCOUNTER — Ambulatory Visit: Payer: Federal, State, Local not specified - PPO | Admitting: Adult Health

## 2019-07-19 ENCOUNTER — Ambulatory Visit: Payer: Federal, State, Local not specified - PPO | Admitting: Mental Health

## 2019-08-02 ENCOUNTER — Ambulatory Visit: Payer: Federal, State, Local not specified - PPO | Admitting: Mental Health

## 2019-09-18 ENCOUNTER — Encounter: Payer: Self-pay | Admitting: Nurse Practitioner

## 2019-09-18 ENCOUNTER — Ambulatory Visit: Payer: Federal, State, Local not specified - PPO | Admitting: Nurse Practitioner

## 2019-09-18 VITALS — BP 136/70 | HR 83 | Temp 98.1°F | Ht 73.0 in | Wt 283.0 lb

## 2019-09-18 DIAGNOSIS — K760 Fatty (change of) liver, not elsewhere classified: Secondary | ICD-10-CM

## 2019-09-18 DIAGNOSIS — R112 Nausea with vomiting, unspecified: Secondary | ICD-10-CM | POA: Diagnosis not present

## 2019-09-18 DIAGNOSIS — K529 Noninfective gastroenteritis and colitis, unspecified: Secondary | ICD-10-CM

## 2019-09-18 MED ORDER — NA SULFATE-K SULFATE-MG SULF 17.5-3.13-1.6 GM/177ML PO SOLN: ORAL | 0 refills | Status: DC

## 2019-09-18 NOTE — Patient Instructions (Signed)
If you are age 45 or older, your body mass index should be between 23-30. Your Body mass index is 37.34 kg/m. If this is out of the aforementioned range listed, please consider follow up with your Primary Care Provider.  If you are age 93 or younger, your body mass index should be between 19-25. Your Body mass index is 37.34 kg/m. If this is out of the aformentioned range listed, please consider follow up with your Primary Care Provider.   You have been scheduled for a colonoscopy. Please follow written instructions given to you at your visit today.  Please pick up your prep supplies at the pharmacy within the next 1-3 days. If you use inhalers (even only as needed), please bring them with you on the day of your procedure. Your physician has requested that you go to www.startemmi.com and enter the access code given to you at your visit today. This web site gives a general overview about your procedure. However, you should still follow specific instructions given to you by our office regarding your preparation for the procedure.  We have sent the following medications to your pharmacy for you to pick up at your convenience: Suprep  Thank you for choosing me and [facility].   Tye Savoy, NP

## 2019-09-18 NOTE — Progress Notes (Signed)
ASSESSMENT / PLAN:   44 year old female with history of anxiety and depression, and Etoh abuse ( inactive).  #Chronic diarrhea  --Present for close to 1 year --Diarrhea nonbloody, no associated weight loss. Has occasional associated cramping. Could be IBS. Imodium nor Pepto very helpful.  --For further evaluation patient will be scheduled for colonoscopy,+/- random biopsies. The risks and benefits of colonoscopy with possible polypectomy / biopsies were discussed and the patient agrees to proceed.  --Follow up with me after colonoscopy.   # Chronic intermittent vomiting without nausea --She thinks this is more of a "gagging" issue". No reflux or dysphagia.  --Declines anti-emetic.  --She feels this is a minor issue right now.  --Further workup pending clinical course  # [facility] of colon polyps in mother. Mother's uncle and Mother's grandmother had colon cancer (both at advanced ages).  -Patient inquires about a colonoscopy. Based on history ( no FDR with CRC <60) she is still average risk. Will see what upcoming colonoscopy shows  # Hx of fatty liver disease per patient --apparently found on 2017 imaging --Normal liver tests July 2020 --Trig 138 --Discussed fatty liver disease and its potential to lead to cirrhosis --She has already stopped Etoh --Discussed importance of weight loss   HPI:    Chief Complaint: Diarrhea and occasional vomiting   Kristina Chambers is a 44 year old female, new to the practice, here mainly for evaluation of diarrhea.  She gives a history of chronic diarrhea dating back nearly a year or so.  Patient has history of alcohol abuse and felt diarrhea was probably secondary to that but she has been abstinent for 7 months now without improvement in the diarrhea.  She cannot relate diarrhea to any of her medications.  She describes having up to 4 loose to watery BMs / dat several times a week.  If she has a bowel movement in between it is soft in  consistency. Diarrhea almost always occurs in the morning prior to breakfast (in a fasting state).  No nocturnal stooling.    Stool nonbloody.  She does occasionally have for abdominal cramping relieved with bowel movements.  She cannot correlate diarrhea to any particular foods such as gluten nor dairy.  She has taken Pepto-Bismol and Imodium without much improvement.  Additionally Kristina Chambers gives a history of chronic intermittent vomiting.  She does not really have associated nausea.  Episodes occur anywhere from 1 time a week to once every other week and is usually after eating or drinking something.  Does not usually vomit undigested food.  No history of GERD and no GERD symptoms.  [name]"s symptoms have not been associated with any weight loss in fact she has gained 60 pounds over the last couple years.  She attributes the weight gain to inactivity.  She plans to try and lose weight soon.  Patient gives a history of alcohol abuse.  She drank heavily for couple years but no alcohol in 7 months.  She gives a history of fatty liver disease found on imaging in [place] in 2017.  Imaging was done for evaluation of  " stomach issues" but she cannot really remember the details. [name]'s mother had colon polyps.  Her mother's uncle and her mother's grandmother had colon cancer (she says at advanced ages).  Data Reviewed:   12/26/2018 TSH normal, CMET normal, CBC normal   Past Medical History:  Diagnosis Date  . Anxiety   . Depression   .  Elevated cholesterol 2020  . HPV in female   . Substance abuse [facility])      Past Surgical History:  Procedure Laterality Date  . CLAVICLE SURGERY    . GYNECOLOGIC CRYOSURGERY     Family History  Problem Relation Age of Onset  . Hypertension Mother   . Colon polyps Mother   . Lupus Maternal Grandmother   . Colon cancer Maternal Grandmother   . Colon cancer Paternal Grandfather    Social History   Tobacco Use  . Smoking status: Former Smoker    Types:  Cigarettes    Quit date: 06/14/2008    Years since quitting: 11.2  . Smokeless tobacco: Never Used  Substance Use Topics  . Alcohol use: Not Currently  . Drug use: Never   Current Outpatient Medications  Medication Sig Dispense Refill  . ARIPiprazole (ABILIFY) 10 MG tablet Take one tablet daily. 90 tablet 1  . DULoxetine (CYMBALTA) 30 MG capsule Take 1 capsule (30 mg total) by mouth daily. 90 capsule 1  . lamoTRIgine (LAMICTAL) 150 MG tablet Take 1 tablet (150 mg total) by mouth daily. 90 tablet 1  . levonorgestrel (MIRENA) 20 MCG/24HR IUD by Intrauterine route.    . naltrexone (DEPADE) 50 MG tablet Take 1 tablet (50 mg total) by mouth daily. 30 tablet 5   No current facility-administered medications for this visit.   No Known Allergies   Review of Systems: Positive for depression, fatigue, anxiety.  All other systems reviewed and negative except where noted in HPI.   Creatinine clearance cannot be calculated (Patient's most recent lab result is older than the maximum 21 days allowed.)   Physical Exam:    Wt Readings from Last 3 Encounters:  09/18/19 283 lb (128.4 kg)  02/26/19 271 lb (122.9 kg)  01/16/19 266 lb 8 oz (120.9 kg)    BP 136/70   Pulse 83   Temp 98.1 F (36.7 C)   Ht 6\' 1"  (1.854 m)   Wt 283 lb (128.4 kg)   SpO2 100%   BMI 37.34 kg/m  Constitutional:  Pleasant female in no acute distress. Psychiatric: Normal mood and affect. Behavior is normal. EENT: Pupils normal.  Conjunctivae are normal. No scleral icterus. Neck supple.  Cardiovascular: Normal rate, regular rhythm. No edema Pulmonary/chest: Effort normal and breath sounds normal. No wheezing, rales or rhonchi. Abdominal: Soft, nondistended, nontender. Bowel sounds active throughout. There are no masses palpable. No hepatomegaly. Neurological: Alert and oriented to person place and time. Skin: Skin is warm and dry. No rashes noted.  Tye Savoy, NP  09/18/2019, 9:05 AM

## 2019-09-18 NOTE — Progress Notes (Signed)
Reviewed and agree with management plans. ? ?Kenshin Splawn L. Kirsten Mckone, MD, MPH  ?

## 2019-09-26 ENCOUNTER — Ambulatory Visit (AMBULATORY_SURGERY_CENTER): Payer: Federal, State, Local not specified - PPO | Admitting: Gastroenterology

## 2019-09-26 ENCOUNTER — Encounter: Payer: Self-pay | Admitting: Gastroenterology

## 2019-09-26 ENCOUNTER — Other Ambulatory Visit: Payer: Self-pay

## 2019-09-26 VITALS — BP 102/57 | HR 76 | Temp 97.3°F | Resp 19 | Ht 73.0 in | Wt 283.0 lb

## 2019-09-26 DIAGNOSIS — K2270 Barrett's esophagus without dysplasia: Secondary | ICD-10-CM | POA: Diagnosis not present

## 2019-09-26 DIAGNOSIS — D124 Benign neoplasm of descending colon: Secondary | ICD-10-CM

## 2019-09-26 DIAGNOSIS — K529 Noninfective gastroenteritis and colitis, unspecified: Secondary | ICD-10-CM

## 2019-09-26 DIAGNOSIS — K635 Polyp of colon: Secondary | ICD-10-CM | POA: Diagnosis not present

## 2019-09-26 DIAGNOSIS — K621 Rectal polyp: Secondary | ICD-10-CM

## 2019-09-26 DIAGNOSIS — K599 Functional intestinal disorder, unspecified: Secondary | ICD-10-CM | POA: Diagnosis not present

## 2019-09-26 DIAGNOSIS — D128 Benign neoplasm of rectum: Secondary | ICD-10-CM

## 2019-09-26 DIAGNOSIS — D122 Benign neoplasm of ascending colon: Secondary | ICD-10-CM

## 2019-09-26 MED ORDER — SODIUM CHLORIDE 0.9 % IV SOLN: 500.0000 mL | Freq: Once | INTRAVENOUS | Status: DC

## 2019-09-26 NOTE — Op Note (Signed)
[place] Patient Name: Aayliah Relihan Procedure Date: 09/26/2019 9:49 AM MRN: [id number] Endoscopist: Thornton Park MD, MD Age: 44 Referring MD:  Date of Birth: March 04, 1976 Gender: Female Account #: [id number] Procedure:                Colonoscopy Indications:              Chronic diarrhea                           Mother with precancerous colon polyps at age 54 Medicines:                Monitored Anesthesia Care Procedure:                Pre-Anesthesia Assessment:                           - Prior to the procedure, a History and Physical                            was performed, and patient medications and                            allergies were reviewed. The patient's tolerance of                            previous anesthesia was also reviewed. The risks                            and benefits of the procedure and the sedation                            options and risks were discussed with the patient.                            All questions were answered, and informed consent                            was obtained. Prior Anticoagulants: The patient has                            taken no previous anticoagulant or antiplatelet                            agents. ASA Grade Assessment: II - A patient with                            mild systemic disease. After reviewing the risks                            and benefits, the patient was deemed in                            satisfactory condition to undergo the procedure.  After obtaining informed consent, the colonoscope                            was passed under direct vision. Throughout the                            procedure, the patient's blood pressure, pulse, and                            oxygen saturations were monitored continuously. The                            Colonoscope was introduced through the anus and                            advanced to the 4 cm into the ileum. A second                             forward view of the right colon was performed. The                            colonoscopy was performed without difficulty. The                            patient tolerated the procedure well. The quality                            of the bowel preparation was good. The terminal                            ileum, ileocecal valve, appendiceal orifice, and                            rectum were photographed. Scope In: 9:54:36 AM Scope Out: 10:09:20 AM Scope Withdrawal Time: 0 hours 9 minutes 56 seconds  Total Procedure Duration: 0 hours 14 minutes 44 seconds  Findings:                 The perianal and digital rectal examinations were                            normal.                           Two sessile polyps were found in the rectum and                            descending colon. The polyps were 1 mm in size.                            These polyps were removed with a cold biopsy                            forceps. Resection and retrieval were complete.  Estimated blood loss was minimal.                           A 8 mm polyp was found in the ascending colon. The                            polyp was flat. The polyp was removed with a                            piecemeal technique using a cold snare. Resection                            and retrieval were complete. Estimated blood loss                            was minimal.                           The colon (entire examined portion) appeared                            normal. Biopsies for histology were taken with a                            cold forceps from the right transverse colon and                            left transverse colon for evaluation of microscopic                            colitis. Estimated blood loss was minimal.                           The exam was otherwise without abnormality on                            direct and retroflexion views. Complications:             No immediate complications. Estimated blood loss:                            Minimal. Estimated Blood Loss:     Estimated blood loss was minimal. Impression:               - Two 1 mm polyps in the rectum and in the                            descending colon, removed with a cold biopsy                            forceps. Resected and retrieved.                           - One 8 mm polyp in the ascending colon, removed  piecemeal using a cold snare. Resected and                            retrieved.                           - The entire examined colon is normal. Biopsied.                           - The examination was otherwise normal on direct                            and retroflexion views. Recommendation:           - Patient has a contact number available for                            emergencies. The signs and symptoms of potential                            delayed complications were discussed with the                            patient. Return to normal activities tomorrow.                            Written discharge instructions were provided to the                            patient.                           - Resume previous diet.                           - Continue present medications.                           - Await pathology results.                           - Repeat colonoscopy date to be determined after                            pending pathology results are reviewed for                            surveillance.                           - Return to GI office to follow-up with NP Chester Holstein. Thornton Park MD, MD 09/26/2019 10:17:45 AM This report has been signed electronically.

## 2019-09-26 NOTE — Patient Instructions (Signed)
Handout given for polyps.  Await pathology results.  YOU HAD AN ENDOSCOPIC PROCEDURE TODAY AT [facility]:   Refer to the procedure report that was given to you for any specific questions about what was found during the examination.  If the procedure report does not answer your questions, please call your gastroenterologist to clarify.  If you requested that your care partner not be given the details of your procedure findings, then the procedure report has been included in a sealed envelope for you to review at your convenience later.  YOU SHOULD EXPECT: Some feelings of bloating in the abdomen. Passage of more gas than usual.  Walking can help get rid of the air that was put into your GI tract during the procedure and reduce the bloating. If you had a lower endoscopy (such as a colonoscopy or flexible sigmoidoscopy) you may notice spotting of blood in your stool or on the toilet paper. If you underwent a bowel prep for your procedure, you may not have a normal bowel movement for a few days.  Please Note:  You might notice some irritation and congestion in your nose or some drainage.  This is from the oxygen used during your procedure.  There is no need for concern and it should clear up in a day or so.  SYMPTOMS TO REPORT IMMEDIATELY:   Following lower endoscopy (colonoscopy or flexible sigmoidoscopy):  Excessive amounts of blood in the stool  Significant tenderness or worsening of abdominal pains  Swelling of the abdomen that is new, acute  Fever of 100F or higher  Black, tarry-looking stools  For urgent or emergent issues, a gastroenterologist can be reached at any hour by calling [phone]. Do not use MyChart messaging for urgent concerns.    DIET:  We do recommend a small meal at first, but then you may proceed to your regular diet.  Drink plenty of fluids but you should avoid alcoholic beverages for 24 hours.  ACTIVITY:  You should plan to take it easy for the  rest of today and you should NOT DRIVE or use heavy machinery until tomorrow (because of the sedation medicines used during the test).    FOLLOW UP: Our staff will call the number listed on your records 48-72 hours following your procedure to check on you and address any questions or concerns that you may have regarding the information given to you following your procedure. If we do not reach you, we will leave a message.  We will attempt to reach you two times.  During this call, we will ask if you have developed any symptoms of COVID 19. If you develop any symptoms (ie: fever, flu-like symptoms, shortness of breath, cough etc.) before then, please call [phone].  If you test positive for Covid 19 in the 2 weeks post procedure, please call and report this information to [place].    If any biopsies were taken you will be contacted by phone or by letter within the next 1-3 weeks.  Please call us at [phone] if you have not heard about the biopsies in 3 weeks.    SIGNATURES/CONFIDENTIALITY: You and/or your care partner have signed paperwork which will be entered into your electronic medical record.  These signatures attest to the fact that that the information above on your After Visit Summary has been reviewed and is understood.  Full responsibility of the confidentiality of this discharge information lies with you and/or your care-partner.

## 2019-09-26 NOTE — Progress Notes (Signed)
PT taken to PACU. Monitors in place. VSS. Report given to RN. 

## 2019-09-26 NOTE — Progress Notes (Signed)
Called to room to assist during endoscopic procedure.  Patient ID and intended procedure confirmed with present staff. Received instructions for my participation in the procedure from the performing physician.  

## 2019-09-26 NOTE — Progress Notes (Signed)
Temp by JB and vitals by CW

## 2019-09-28 ENCOUNTER — Telehealth: Payer: Self-pay

## 2019-09-28 NOTE — Telephone Encounter (Signed)
  Follow up Call-  Call back number 09/26/2019  Post procedure Call Back phone  # [phone]  Permission to leave phone message Yes     Patient questions:  Do you have a fever, pain , or abdominal swelling? No. Pain Score  0 *  Have you tolerated food without any problems? Yes.    Have you been able to return to your normal activities? Yes.    Do you have any questions about your discharge instructions: Diet   No. Medications  No. Follow up visit  No.  Do you have questions or concerns about your Care? No.  Actions: * If pain score is 4 or above: No action needed, pain <4. 1. Have you developed a fever since your procedure? no  2.   Have you had an respiratory symptoms (SOB or cough) since your procedure? no  3.   Have you tested positive for COVID 19 since your procedure no  4.   Have you had any family members/close contacts diagnosed with the COVID 19 since your procedure?  no   If yes to any of these questions please route to Joylene John, RN and Erenest Rasher, RN

## 2019-10-02 ENCOUNTER — Encounter: Payer: Self-pay | Admitting: Gastroenterology

## 2019-10-18 ENCOUNTER — Ambulatory Visit: Payer: Federal, State, Local not specified - PPO | Admitting: Nurse Practitioner

## 2019-10-18 ENCOUNTER — Encounter: Payer: Self-pay | Admitting: Nurse Practitioner

## 2019-10-18 VITALS — BP 110/66 | HR 84 | Temp 98.3°F | Ht 73.0 in | Wt 277.6 lb

## 2019-10-18 DIAGNOSIS — K529 Noninfective gastroenteritis and colitis, unspecified: Secondary | ICD-10-CM

## 2019-10-18 DIAGNOSIS — R110 Nausea: Secondary | ICD-10-CM

## 2019-10-18 DIAGNOSIS — Z86010 Personal history of colonic polyps: Secondary | ICD-10-CM

## 2019-10-18 DIAGNOSIS — R1110 Vomiting, unspecified: Secondary | ICD-10-CM | POA: Diagnosis not present

## 2019-10-18 NOTE — Progress Notes (Signed)
IMPRESSION and PLAN:    44 year old female with history of alcoholism (in remission), anxiety, depression  # Chronic diarrhea --Nearly resolved after making dietary changes.  Recommended to patient that when she time comes to liberalize diet she should keep a food diary and reintroduce one food at a time to identify culprits  # Chronic morning time nausea/vomiting.   --Occurs in the a.m. right after drinking a cup of coffee with half-and-half.  Otherwise she feels fine, emesis low volume.  Perhaps caffeine on an empty stomach is causing her symptoms.  She will omit coffee for a few days and see if it makes any difference. I have asked her to call or return to the office for follow-up if she has persistent symptoms --Does not smoke THC  # History of colon polyps April 2021.  --Five-year recall colonoscopy recommended  HPI:    Primary GI: .Thornton Park, MD  Chief complaint : Following up after colonoscopy  Kristina Chambers is a 44 year old female who I saw early April of this year for evaluation of chronic diarrhea and chronic intermittent vomiting.   Patient underwent colonoscopy for further evaluation of diarrhea on 09/26/2019.  Two small polyps were removed, random biopsies negative.  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 Patient here for follow-up after colonoscopy  HISTORY SINCE LAST VISIT [name]'s diarrhea has significantly improved.  He attributes this to dietary changes.  She stopped eating processed food, eating more fruits and vegetables now.  Is drinking more water.  She has had only 2 episodes of loose stool in the last 2 weeks.  She is averaging 2-3 solid bowel movements a day.    When I saw [name] early April she gave a history of chronic nausea and vomiting.  Tums not related to eating but mainly occur in the morning after a cup of coffee with half-and-half.  Emesis is small volume.  She has no abdominal pain.    Previous Endoscopic  Evaluations: 09/26/19 colonoscopy Two 1 mm polyps in the rectum and in the descending colon, removed with a cold biopsy forceps. Resected and retrieved. - One 8 mm polyp in the ascending colon, removed piecemeal using a cold snare. Resected and retrieved.  Review of systems:     No chest pain, no SOB, no fevers, no urinary sx   Past Medical History:  Diagnosis Date  . Anxiety   . Depression   . Elevated cholesterol 2020  . HPV in female   . Substance abuse ([place])     Patient's surgical history, family medical history, social history, medications and allergies were all reviewed in Epic   Creatinine clearance cannot be calculated (Patient's most recent lab result is older than the maximum 21 days allowed.)  Current Outpatient Medications  Medication Sig Dispense Refill  . ARIPiprazole (ABILIFY) 10 MG tablet Take one tablet daily. 90 tablet 1  . DULoxetine (CYMBALTA) 30 MG capsule Take 1 capsule (30 mg total) by mouth daily. 90 capsule 1  . lamoTRIgine (LAMICTAL) 150 MG tablet Take 1 tablet (150 mg total) by mouth daily. 90 tablet 1  . levonorgestrel (MIRENA) 20 MCG/24HR IUD by Intrauterine route.    . naltrexone (DEPADE) 50 MG tablet Take 1 tablet (50 mg total) by mouth daily. 30 tablet 5   No current facility-administered medications for this visit.    Physical Exam:     BP 110/66   Pulse 84   Temp 98.3 F (36.8 C)  Ht 6\' 1"  (1.854 m)   Wt 277 lb 9.6 oz (125.9 kg)   BMI 36.62 kg/m   GENERAL:  Pleasant female in NAD PSYCH: : Cooperative, normal affect CARDIAC:  RRR PULM: Normal respiratory effort, lungs CTA bilaterally, no wheezing ABDOMEN:  Nondistended, soft, nontender. No obvious masses, no hepatomegaly,  normal bowel sounds SKIN:  turgor, no lesions seen Musculoskeletal:  Normal muscle tone, normal strength NEURO: Alert and oriented x 3, no focal neurologic deficits   Tye Savoy , NP 10/18/2019, 11:36 AM

## 2019-10-18 NOTE — Patient Instructions (Signed)
If you are age 45 or older, your body mass index should be between 23-30. Your Body mass index is 36.62 kg/m. If this is out of the aforementioned range listed, please consider follow up with your Primary Care Provider.  If you are age 36 or younger, your body mass index should be between 19-25. Your Body mass index is 36.62 kg/m. If this is out of the aformentioned range listed, please consider follow up with your Primary Care Provider.   Follow up as needed.  Thank you for choosing me and [facility].   Tye Savoy, NP

## 2019-10-19 NOTE — Progress Notes (Signed)
Reviewed and agree with management plans. ? ?Stephenie Navejas L. Isai Gottlieb, MD, MPH  ?

## 2019-12-05 ENCOUNTER — Other Ambulatory Visit: Payer: Self-pay

## 2019-12-05 ENCOUNTER — Encounter (INDEPENDENT_AMBULATORY_CARE_PROVIDER_SITE_OTHER): Payer: Self-pay

## 2019-12-05 ENCOUNTER — Ambulatory Visit (INDEPENDENT_AMBULATORY_CARE_PROVIDER_SITE_OTHER): Payer: Federal, State, Local not specified - PPO | Admitting: Adult Health

## 2019-12-05 ENCOUNTER — Encounter: Payer: Self-pay | Admitting: Adult Health

## 2019-12-05 DIAGNOSIS — F331 Major depressive disorder, recurrent, moderate: Secondary | ICD-10-CM | POA: Diagnosis not present

## 2019-12-05 DIAGNOSIS — F411 Generalized anxiety disorder: Secondary | ICD-10-CM

## 2019-12-05 MED ORDER — LAMOTRIGINE 150 MG PO TABS: 150.0000 mg | ORAL_TABLET | Freq: Every day | ORAL | 3 refills | Status: DC

## 2019-12-05 MED ORDER — ARIPIPRAZOLE 15 MG PO TABS: ORAL_TABLET | ORAL | 3 refills | Status: DC

## 2019-12-05 NOTE — Progress Notes (Signed)
Kristina Chambers [id number] [birth year] 44 y.o.  Subjective:   Patient ID:  Kristina Chambers is a 44 y.o. (DOB 1975/08/28) female.  Chief Complaint: No chief complaint on file.   HPI Kristina Chambers presents to the office today for follow-up of   Describes mood today as "ok". Pleasant. Mood symptoms - reports some depression - "recent dip". Denies anxiety and irritability. Having good and bad weeks over the past 3 months. Staying in a low to moderate mood - "never good". Stopped Naltrexone a month ago. Has a new sponsor. Keeping up with meetings. Decreased interest and motivation. Taking medications as prescribed. Seeing therapist. Energy levels lower. Active, does not have a regular exercise routine. Plans to start again. 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 Enjoys some usual interests and activities. Single. Lives with 18 year old son. Boyfriend of 2 and 1/2 years lives in [place]. Appetite adequate. Weight loss - 270 pounds. Sleeps wells most nights. Averages to 8 hours.  Focus and concentration "decent". Completing tasks. Managing some aspects of household. Doing well in work setting. Has returned to office setting.  Denies SI or HI. Denies AH or VH.  Previous medications: Wellbutrin, Cymbalta   Review of Systems:  Review of Systems  Musculoskeletal: Negative for gait problem.  Neurological: Negative for tremors.  Psychiatric/Behavioral:       Please refer to HPI    Medications: I have reviewed the patient's current medications.  Current Outpatient Medications  Medication Sig Dispense Refill   ARIPiprazole (ABILIFY) 15 MG tablet Take one tablet daily. 90 tablet 3   lamoTRIgine (LAMICTAL) 150 MG tablet Take 1 tablet (150 mg total) by mouth daily. 90 tablet 3   levonorgestrel (MIRENA) 20 MCG/24HR IUD by Intrauterine route.     No current facility-administered medications for this visit.    Medication Side Effects: None  Allergies: No Known Allergies  Past Medical  History:  Diagnosis Date   Anxiety    Depression    Elevated cholesterol 2020   HPV in female    Substance abuse ([place])     Family History  Problem Relation Age of Onset   Hypertension Mother    Colon polyps Mother    Lupus Maternal Grandmother    Colon cancer Maternal Grandmother    Colon cancer Paternal Grandfather    Stomach cancer Neg Hx    Esophageal cancer Neg Hx     Social History   Socioeconomic History   Marital status: Single    Spouse name: Not on file   Number of children: Not on file   Years of education: Not on file   Highest education level: Not on file  Occupational History   Not on file  Tobacco Use   Smoking status: Former Smoker    Types: Cigarettes    Quit date: 06/14/2008    Years since quitting: 11.4   Smokeless tobacco: Never Used  Scientific laboratory technician Use: Never used  Substance and Sexual Activity   Alcohol use: Not Currently   Drug use: Not Currently   Sexual activity: Yes    Birth control/protection: I.U.D.    Comment: Mirena insertion summer of 2017  Other Topics Concern   Not on file  Social History Narrative   Not on file   Social Determinants of Health   Financial Resource Strain:    Difficulty of Paying Living Expenses:   Food Insecurity:    Worried About [place] in the Last Year:  Ran Out of Food in the Last Year:   Transportation Needs:    Film/video editor (Medical):    Lack of Transportation (Non-Medical):   Physical Activity:    Days of Exercise per Week:    Minutes of Exercise per Session:   Stress:    Feeling of Stress :   Social Connections:    Frequency of Communication with Friends and Family:    Frequency of Social Gatherings with Friends and Family:    Attends Religious Services:    Active Member of Clubs or Organizations:    Attends Music therapist:    Marital Status:   Intimate Partner Violence:    Fear of Current or Ex-Partner:     Emotionally Abused:    Physically Abused:    Sexually Abused:     Past Medical History, Surgical history, Social history, and Family history were reviewed and updated as appropriate.   Please see review of systems for further details on the patient's review from today.   Objective:   Physical Exam:  There were no vitals taken for this visit.  Physical Exam Constitutional:      General: She is not in acute distress. Musculoskeletal:        General: No deformity.  Neurological:     Mental Status: She is alert and oriented to person, place, and time.     Coordination: Coordination normal.  Psychiatric:        Attention and Perception: Attention and perception normal. She does not perceive auditory or visual hallucinations.        Mood and Affect: Mood normal. Mood is not anxious or depressed. Affect is not labile, blunt, angry or inappropriate.        Speech: Speech normal.        Behavior: Behavior normal.        Thought Content: Thought content normal. Thought content is not paranoid or delusional. Thought content does not include homicidal or suicidal ideation. Thought content does not include homicidal or suicidal plan.        Cognition and Memory: Cognition and memory normal.        Judgment: Judgment normal.     Comments: Insight intact     Lab Review:     Component Value Date/Time   NA 138 12/26/2018 1144   K 4.6 12/26/2018 1144   CL 101 12/26/2018 1144   CO2 23 12/26/2018 1144   GLUCOSE 94 12/26/2018 1144   BUN 10 12/26/2018 1144   CREATININE 0.73 12/26/2018 1144   CALCIUM 9.6 12/26/2018 1144   PROT 6.9 12/26/2018 1144   ALBUMIN 4.4 12/26/2018 1144   AST 17 12/26/2018 1144   ALT 19 12/26/2018 1144   ALKPHOS 104 12/26/2018 1144   BILITOT 1.0 12/26/2018 1144   GFRNONAA 101 12/26/2018 1144   GFRAA 117 12/26/2018 1144       Component Value Date/Time   WBC 8.2 12/26/2018 1144   RBC 4.44 12/26/2018 1144   HGB 13.0 12/26/2018 1144   HCT 38.6 12/26/2018 1144    PLT 337 12/26/2018 1144   MCV 87 12/26/2018 1144   MCH 29.3 12/26/2018 1144   MCHC 33.7 12/26/2018 1144   RDW 11.7 12/26/2018 1144    No results found for: POCLITH, LITHIUM   No results found for: PHENYTOIN, PHENOBARB, VALPROATE, CBMZ   .res Assessment: Plan:     Plan:  Lamictal 150mg  daily   Increase Abilify 10mg  to 15mg  daily to target depression  D/C Cymbalta  30mg  daily D/C Naltrexone 50mg  daily for ETOH cravings - stopped a month  Uses Melatonin some nights  RTC 2 months  Patient advised to contact office with any questions, adverse effects, or acute worsening in signs and symptoms.  Discussed potential metabolic side effects associated with atypical antipsychotics, as well as potential risk for movement side effects. Advised pt to contact office if movement side effects occur.   Counseled patient regarding potential benefits, risks, and side effects of Lamictal to include potential risk of Stevens-Johnson syndrome. Advised patient to stop taking Lamictal and contact office immediately if rash develops and to seek urgent medical attention if rash is severe and/or spreading quickly.     Diagnoses and all orders for this visit:  Major depressive disorder, recurrent episode, moderate (HCC) -     lamoTRIgine (LAMICTAL) 150 MG tablet; Take 1 tablet (150 mg total) by mouth daily. -     ARIPiprazole (ABILIFY) 15 MG tablet; Take one tablet daily.  Generalized anxiety disorder -     lamoTRIgine (LAMICTAL) 150 MG tablet; Take 1 tablet (150 mg total) by mouth daily. -     ARIPiprazole (ABILIFY) 15 MG tablet; Take one tablet daily.     Please see After Visit Summary for patient specific instructions.  Future Appointments  Date Time Provider [place]  01/15/2020  9:00 AM Martinique, Betty G, MD [facility]  01/23/2020 11:30 AM Nunzio Cobbs, MD [place] None    No orders of the defined types were placed in this  encounter.   -------------------------------

## 2019-12-10 ENCOUNTER — Other Ambulatory Visit: Payer: Self-pay | Admitting: Adult Health

## 2019-12-10 DIAGNOSIS — F411 Generalized anxiety disorder: Secondary | ICD-10-CM

## 2019-12-10 DIAGNOSIS — F331 Major depressive disorder, recurrent, moderate: Secondary | ICD-10-CM

## 2020-01-15 ENCOUNTER — Other Ambulatory Visit: Payer: Self-pay

## 2020-01-15 ENCOUNTER — Encounter: Payer: Self-pay | Admitting: Family Medicine

## 2020-01-15 ENCOUNTER — Ambulatory Visit: Payer: Federal, State, Local not specified - PPO | Admitting: Family Medicine

## 2020-01-15 VITALS — BP 130/80 | HR 88 | Resp 16 | Ht 73.0 in | Wt 273.5 lb

## 2020-01-15 DIAGNOSIS — Z6836 Body mass index (BMI) 36.0-36.9, adult: Secondary | ICD-10-CM

## 2020-01-15 DIAGNOSIS — E785 Hyperlipidemia, unspecified: Secondary | ICD-10-CM | POA: Diagnosis not present

## 2020-01-15 DIAGNOSIS — Z131 Encounter for screening for diabetes mellitus: Secondary | ICD-10-CM

## 2020-01-15 DIAGNOSIS — E669 Obesity, unspecified: Secondary | ICD-10-CM | POA: Insufficient documentation

## 2020-01-15 MED ORDER — OZEMPIC (1 MG/DOSE) 2 MG/1.5ML ~~LOC~~ SOPN: 1.0000 mg | PEN_INJECTOR | SUBCUTANEOUS | 2 refills | Status: DC

## 2020-01-15 NOTE — Progress Notes (Signed)
HPI: Kristina Chambers is a 44 y.o. female, who is here today to establish care.  Former PCP: Dr Kittie Plater. Last preventive routine visit: 2017.  Chronic medical problems: HLD,depression,and hx of alcoholism.She has not had alcohol in 11 months. No hx of diabetes or HTN.  Dx'ed with depression in her mid 20's. She is on Lamictal and Abilify.  She follows with psychiatrist. She lives with her 31 yo son.  She does not exercise regularly and has not been consistent with following a healthful diet. HLD on non pharmacologic treatment. Negative for chest pain, dyspnea, palpitation,focal weakness, or edema.  Lab Results  Component Value Date   CHOL 251 (H) 12/26/2018   HDL 54 12/26/2018   LDLCALC 169 (H) 12/26/2018   TRIG 138 12/26/2018   CHOLHDL 4.6 (H) 12/26/2018   Review of Systems  Constitutional: Negative for activity change, appetite change and fever.  HENT: Negative for mouth sores, nosebleeds and sore throat.   Eyes: Negative for redness and visual disturbance.  Respiratory: Negative for cough and wheezing.   Gastrointestinal: Negative for abdominal pain, nausea and vomiting.       Negative for changes in bowel habits.  Genitourinary: Negative for decreased urine volume and hematuria.  Neurological: Negative for syncope and headaches.  Rest see pertinent positives and negatives per HPI.  Current Outpatient Medications on File Prior to Visit  Medication Sig Dispense Refill  . ARIPiprazole (ABILIFY) 15 MG tablet Take one tablet daily. 90 tablet 3  . lamoTRIgine (LAMICTAL) 150 MG tablet Take 1 tablet (150 mg total) by mouth daily. 90 tablet 3  . levonorgestrel (MIRENA) 20 MCG/24HR IUD by Intrauterine route.     No current facility-administered medications on file prior to visit.     Past Medical History:  Diagnosis Date  . Anxiety   . Chicken pox   . Depression   . Elevated cholesterol 2020  . HPV in female   . Substance abuse ([place])    No Known  Allergies  Family History  Problem Relation Age of Onset  . Hypertension Mother   . Colon polyps Mother   . Lupus Maternal Grandmother   . Colon cancer Maternal Grandmother   . Colon cancer Paternal Grandfather   . Stomach cancer Neg Hx   . Esophageal cancer Neg Hx     Social History   Socioeconomic History  . Marital status: Single    Spouse name: Not on file  . Number of children: Not on file  . Years of education: Not on file  . Highest education level: Not on file  Occupational History  . Not on file  Tobacco Use  . Smoking status: Former Smoker    Types: Cigarettes    Quit date: 06/14/2008    Years since quitting: 11.6  . Smokeless tobacco: Never Used  Vaping Use  . Vaping Use: Never used  Substance and Sexual Activity  . Alcohol use: Not Currently  . Drug use: Not Currently  . Sexual activity: Yes    Birth control/protection: I.U.D.    Comment: Mirena insertion summer of 2017  Other Topics Concern  . Not on file  Social History Narrative  . Not on file   Social Determinants of Health   Financial Resource Strain:   . Difficulty of Paying Living Expenses:   Food Insecurity:   . Worried About Charity fundraiser in the Last Year:   . Arboriculturist in the Last Year:   Transportation Needs:   .  Lack of Transportation (Medical):   Marland Kitchen Lack of Transportation (Non-Medical):   Physical Activity:   . Days of Exercise per Week:   . Minutes of Exercise per Session:   Stress:   . Feeling of Stress :   Social Connections:   . Frequency of Communication with Friends and Family:   . Frequency of Social Gatherings with Friends and Family:   . Attends Religious Services:   . Active Member of Clubs or Organizations:   . Attends Archivist Meetings:   Marland Kitchen Marital Status:    Vitals:   01/15/20 0846  BP: 130/80  Pulse: 88  Resp: 16  SpO2: 95%   Body mass index is 36.08 kg/m.  Physical Exam Vitals and nursing note reviewed.  Constitutional:       General: She is not in acute distress.    Appearance: She is well-developed.  HENT:     Head: Normocephalic and atraumatic.     Mouth/Throat:     Mouth: Mucous membranes are moist.     Pharynx: Oropharynx is clear.  Eyes:     Conjunctiva/sclera: Conjunctivae normal.     Pupils: Pupils are equal, round, and reactive to light.  Cardiovascular:     Rate and Rhythm: Normal rate and regular rhythm.     Pulses:          Dorsalis pedis pulses are 2+ on the right side and 2+ on the left side.     Heart sounds: No murmur heard.   Pulmonary:     Effort: Pulmonary effort is normal. No respiratory distress.     Breath sounds: Normal breath sounds.  Abdominal:     Palpations: Abdomen is soft. There is no hepatomegaly or mass.     Tenderness: There is no abdominal tenderness.  Lymphadenopathy:     Cervical: No cervical adenopathy.  Skin:    General: Skin is warm.     Findings: No erythema or rash.  Neurological:     Mental Status: She is alert and oriented to person, place, and time.     Cranial Nerves: No cranial nerve deficit.     Gait: Gait normal.  Psychiatric:     Comments: Well groomed, good eye contact.    ASSESSMENT AND PLAN:  Kristina Chambers was seen today for establish care.  Diagnoses and all orders for this visit:  Orders Placed This Encounter  Procedures  . Basic metabolic panel  . Lipid panel  . Hemoglobin A1c   Lab Results  Component Value Date   CREATININE 0.74 01/15/2020   BUN 8 01/15/2020   NA 139 01/15/2020   K 4.6 01/15/2020   CL 105 01/15/2020   CO2 24 01/15/2020   Lab Results  Component Value Date   CHOL 213 (H) 01/15/2020   HDL 42 (L) 01/15/2020   LDLCALC 146 (H) 01/15/2020   TRIG 124 01/15/2020   CHOLHDL 5.1 (H) 01/15/2020   Lab Results  Component Value Date   HGBA1C 5.8 (H) 01/15/2020   The 10-year ASCVD risk score Mikey Bussing DC Jr., et al., 2013) is: 1.4%   Values used to calculate the score:     Age: 34 years     Sex: Female     Is Non-Hispanic  African American: No     Diabetic: No     Tobacco smoker: No     Systolic Blood Pressure: 195 mmHg     Is BP treated: No     HDL Cholesterol: 42 mg/dL  Total Cholesterol: 213 mg/dL   Class 2 obesity with body mass index (BMI) of 36.0 to 36.9 in adult We discussed benefits of wt loss as well as adverse effects of obesity. Consistency with healthy diet and physical activity recommended. Weight Watchers is a good option as well as daily brisk walking for 15-30 min as tolerated. We discussed a few pharmacologic options, after discussion of some side effects, she would like to try Ozempic.  Hyperlipidemia Continue nonpharmacologic treatment. Further recommendation will be given according to 10-year CVD risk score and lipid panel numbers.   Diabetes mellitus screening -     Hemoglobin A1c   Return in about 2 months (around 03/16/2020) for wt.     Gemma Ruan G. Martinique, MD  [facility]. [place] office.

## 2020-01-15 NOTE — Patient Instructions (Addendum)
If we have ordered labs or studies at this visit, it can take up to 1-2 weeks for results and processing. IF results require follow up or explanation, we will call you with instructions. Clinically stable results will be released to your [facility]. If you have not heard from [place] or cannot find your results in [facility] in 2 weeks please contact our office at [phone].  If you are not yet signed up for [facility], please consider signing up.  A few things to remember from today's visit:   Diabetes mellitus screening - Plan: Hemoglobin A1c  Hyperlipidemia, unspecified hyperlipidemia type - Plan: Basic metabolic panel, Lipid panel, Hemoglobin A1c  If you need refills please call your pharmacy. Do not use My Chart to request refills or for acute issues that need immediate attention.   Ozempic started today for wt loss. Start lower dose, 0.25 mg weekly x 2 then 0.5 mg weekly x 2, 1.0 mg weekly,and 2 mg weekly.   Please be sure medication list is accurate. If a new problem present, please set up appointment sooner than planned today.

## 2020-01-15 NOTE — Assessment & Plan Note (Signed)
We discussed benefits of wt loss as well as adverse effects of obesity. Consistency with healthy diet and physical activity recommended. Weight Watchers is a good option as well as daily brisk walking for 15-30 min as tolerated. We discussed a few pharmacologic options, after discussion of some side effects, she would like to try Ozempic.

## 2020-01-15 NOTE — Assessment & Plan Note (Signed)
Continue nonpharmacologic treatment. Further recommendation will be given according to 10-year CVD risk score and lipid panel numbers.

## 2020-01-16 ENCOUNTER — Other Ambulatory Visit: Payer: Self-pay | Admitting: Family Medicine

## 2020-01-16 LAB — LIPID PANEL
Cholesterol: 213 mg/dL — ABNORMAL HIGH (ref <200)
HDL: 42 mg/dL — ABNORMAL LOW (ref >=50)
LDL Cholesterol (Calc): 146 mg/dL (calc) — ABNORMAL HIGH
Non-HDL Cholesterol (Calc): 171 mg/dL (calc) — ABNORMAL HIGH (ref <130)
Total CHOL/HDL Ratio: 5.1 (calc) — ABNORMAL HIGH (ref <5.0)
Triglycerides: 124 mg/dL (ref <150)

## 2020-01-16 LAB — HEMOGLOBIN A1C
Hgb A1c MFr Bld: 5.8 % of total Hgb — ABNORMAL HIGH (ref <5.7)
Mean Plasma Glucose: 120 (calc)
eAG (mmol/L): 6.6 (calc)

## 2020-01-16 LAB — BASIC METABOLIC PANEL
BUN: 8 mg/dL (ref 7–25)
CO2: 24 mmol/L (ref 20–32)
Calcium: 9.7 mg/dL (ref 8.6–10.2)
Chloride: 105 mmol/L (ref 98–110)
Creat: 0.74 mg/dL (ref 0.50–1.10)
Glucose, Bld: 96 mg/dL (ref 65–99)
Potassium: 4.6 mmol/L (ref 3.5–5.3)
Sodium: 139 mmol/L (ref 135–146)

## 2020-01-16 NOTE — Addendum Note (Signed)
Addended by: Nathanial Millman E on: 01/16/2020 12:52 PM   Modules accepted: Orders

## 2020-01-16 NOTE — Telephone Encounter (Signed)
Pt stated the wrong dose was called in for the medication Ozempic. She said it is suppose to be a lower dosage of 0.25mg   Ozempic 0.25mg  [id number] - [place],  - [address] Phone:  [phone]  Fax:  [phone]

## 2020-01-18 ENCOUNTER — Other Ambulatory Visit: Payer: Self-pay | Admitting: Family Medicine

## 2020-01-18 ENCOUNTER — Other Ambulatory Visit: Payer: Self-pay

## 2020-01-18 ENCOUNTER — Encounter: Payer: Self-pay | Admitting: Family Medicine

## 2020-01-18 MED ORDER — LOVASTATIN 20 MG PO TABS
20.0000 mg | ORAL_TABLET | Freq: Every day | ORAL | 3 refills | Status: DC
Start: 2020-01-18 — End: 2020-04-16

## 2020-01-18 MED ORDER — OZEMPIC (0.25 OR 0.5 MG/DOSE) 2 MG/1.5ML ~~LOC~~ SOPN: 0.5000 mg | PEN_INJECTOR | SUBCUTANEOUS | 2 refills | Status: DC

## 2020-01-18 NOTE — Telephone Encounter (Addendum)
Pt is calling in to check the status of Rx Ozempic 0.25 stated that it was called in for 1 MG to the pharmacy.  Pt is aware that it is on the providers desk for approval per Judson Roch and they will send it in today.

## 2020-01-21 NOTE — Progress Notes (Signed)
44 y.o. G15P0001 Single Caucasian female here for annual exam.    Has occasional spotting with Mirena.   Declines STD testing.   Working on weight loss.  Health eating and taking Ozempic.   Has chronic diarrhea.  Normal colonoscopy.  Irritation at the anal opening from wiping.   Received Covid vaccination with Moderna.   PCP: Betty Martinique, MD  No LMP recorded. (Menstrual status: IUD).     Period Cycle (Days):  (only occ. spotting with Mirena IUD)     Sexually active: Yes.    The current method of family planning is IUD--Mirena 11/2015.    Exercising: No.  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   reports that she quit smoking about 11 years ago. Her smoking use included cigarettes. She has never used smokeless tobacco. She reports previous alcohol use. She reports previous drug use.  Past Medical History:  Diagnosis Date  . Anxiety   . Chicken pox   . Depression   . Elevated cholesterol 2020  . HPV in female   . Substance abuse [facility])     Past Surgical History:  Procedure Laterality Date  . CLAVICLE SURGERY    . GYNECOLOGIC CRYOSURGERY      Current Outpatient Medications  Medication Sig Dispense Refill  . ARIPiprazole (ABILIFY) 15 MG tablet Take one tablet daily. 90 tablet 3  . lamoTRIgine (LAMICTAL) 150 MG tablet Take 1 tablet (150 mg total) by mouth daily. 90 tablet 3  . levonorgestrel (MIRENA) 20 MCG/24HR IUD by Intrauterine route.    . lovastatin (MEVACOR) 20 MG tablet Take 1 tablet (20 mg total) by  mouth at bedtime. 30 tablet 3  . Semaglutide,0.25 or 0.5MG /DOS, (OZEMPIC, 0.25 OR 0.5 MG/DOSE,) 2 MG/1.5ML SOPN Inject 0.375 mLs (0.5 mg total) into the skin once a week. 1.5 mL 2   No current facility-administered medications for this visit.    Family History  Problem Relation Age of Onset  . Hypertension Mother   . Colon polyps Mother   . Lupus Maternal Grandmother   . Colon cancer Maternal Grandmother   . Colon cancer Paternal Grandfather   . Stomach cancer Neg Hx   . Esophageal cancer Neg Hx     Review of Systems  All other systems reviewed and are negative.   Exam:   BP 118/76 (Cuff Size: Large)   Pulse 88   Resp 16   Ht 6\' 5"  (1.956 m)   Wt 268 lb (121.6 kg)   BMI 31.78 kg/m     General appearance: alert, cooperative and appears stated age Head: normocephalic, without obvious abnormality, atraumatic Neck: no adenopathy, supple, symmetrical, trachea midline and thyroid normal to inspection and palpation Lungs: clear to auscultation bilaterally Breasts: normal appearance, no masses or tenderness, No nipple retraction or dimpling, No nipple discharge or bleeding, No axillary adenopathy Heart: regular rate and rhythm Abdomen: soft, non-tender; no masses, no organomegaly Extremities: extremities normal, atraumatic, no cyanosis or edema Skin: skin color, texture, turgor normal. No rashes or lesions Lymph nodes: cervical, supraclavicular, and axillary  nodes normal. Neurologic: grossly normal  Pelvic: External genitalia:  no lesions              No abnormal inguinal nodes palpated.              Urethra:  normal appearing urethra with no masses, tenderness or lesions              Bartholins and Skenes: normal                 Vagina: normal appearing vagina with normal color and discharge, no lesions              Cervix: no lesions.  IUD strings seen and are short.               Pap taken: Yes.   Bimanual Exam:  Uterus:  normal size, contour, position, consistency,  mobility, non-tender              Adnexa: no mass, fullness, tenderness              Rectal exam: Yes.  .  Confirms.              Anus:  normal sphincter tone, white skin change around the anus and fissure at 4:00.  Tender.   Chaperone was present for exam.  Assessment:   Well woman visit with normal exam. Hx LGSIL. Hx cryotherapy in past.  Mirena IUD.  Depression and anxiety.  Weight loss.  Chronic diarrhea with anal irritation.   Plan: Mammogram screening discussed. Self breast awareness reviewed. Pap and HR HPV taken.  Guidelines for Calcium, Vitamin D, regular exercise program including cardiovascular and weight bearing exercise. We talked about 6 years of use of her Mirena IUD.  Final HPV vaccine.  She will contact her GI about the anal irritation due to her chronic diarrhea.  Follow up annually and prn.   After visit summary provided.

## 2020-01-23 ENCOUNTER — Ambulatory Visit: Payer: Federal, State, Local not specified - PPO | Admitting: Obstetrics and Gynecology

## 2020-01-23 ENCOUNTER — Other Ambulatory Visit (HOSPITAL_COMMUNITY)
Admission: RE | Admit: 2020-01-23 | Discharge: 2020-01-23 | Disposition: A | Discharge status: Home / Self Care | Payer: Federal, State, Local not specified - PPO | Source: Ambulatory Visit | Attending: Obstetrics and Gynecology | Admitting: Obstetrics and Gynecology

## 2020-01-23 ENCOUNTER — Encounter: Payer: Self-pay | Admitting: Obstetrics and Gynecology

## 2020-01-23 ENCOUNTER — Other Ambulatory Visit: Payer: Self-pay

## 2020-01-23 VITALS — BP 118/76 | HR 88 | Resp 16 | Ht 77.0 in | Wt 268.0 lb

## 2020-01-23 DIAGNOSIS — Z01419 Encounter for gynecological examination (general) (routine) without abnormal findings: Secondary | ICD-10-CM | POA: Insufficient documentation

## 2020-01-23 DIAGNOSIS — Z23 Encounter for immunization: Secondary | ICD-10-CM | POA: Diagnosis not present

## 2020-01-23 NOTE — Patient Instructions (Signed)

## 2020-01-24 ENCOUNTER — Other Ambulatory Visit: Payer: Self-pay | Admitting: Obstetrics and Gynecology

## 2020-01-24 DIAGNOSIS — Z1231 Encounter for screening mammogram for malignant neoplasm of breast: Secondary | ICD-10-CM

## 2020-01-25 LAB — CYTOLOGY - PAP
Comment: NEGATIVE
Diagnosis: NEGATIVE
Diagnosis: REACTIVE
High risk HPV: POSITIVE — AB

## 2020-01-28 ENCOUNTER — Telehealth: Payer: Self-pay | Admitting: *Deleted

## 2020-01-28 DIAGNOSIS — R87810 Cervical high risk human papillomavirus (HPV) DNA test positive: Secondary | ICD-10-CM

## 2020-01-28 NOTE — Telephone Encounter (Signed)
-----   Message from Nunzio Cobbs, MD sent at 01/27/2020  7:49 PM EDT ----- Please contact patient with results of testing. 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  Her pap cells were normal.   I am recommending a colposcopy of the cervix, vagina, and vulva.  Please send to precert.  She had skin changes around the anus at the time of her examination that also need some further evaluation.   I understand she has had some chronic diarrhea, but I want to understand if she has some HPV changes in that area as well.

## 2020-01-28 NOTE — Telephone Encounter (Signed)
Burnice Logan, RN  01/28/2020 1:51 PM EDT Back to Top    Left message to call Sharee Pimple, RN at [place].

## 2020-01-29 NOTE — Telephone Encounter (Signed)
Spoke with patient. Advised of all results as seen below per Dr. Quincy Simmonds. Patient agreeable to proceed with colpo. No menses with IUD, occasional spotting.  Colpo scheduled for 02/21/20 at 11:30am. Patient declined earlier appt offered.  Colpo order placed for precert. Advised to take Motrin 800 mg with food and water one hour before procedure. Patient verbalizes understanding and is agreeable.   Routing to [employer] and [employer]  for [employer].   Encounter closed.

## 2020-01-29 NOTE — Telephone Encounter (Signed)
Patient is returning a call to Jill. °

## 2020-01-30 ENCOUNTER — Telehealth: Payer: Self-pay | Admitting: Obstetrics and Gynecology

## 2020-01-30 NOTE — Telephone Encounter (Signed)
Call placed to convey benefits for 02/21/20 appointment .

## 2020-02-01 ENCOUNTER — Telehealth: Payer: Self-pay | Admitting: Adult Health

## 2020-02-01 ENCOUNTER — Other Ambulatory Visit: Payer: Self-pay | Admitting: Adult Health

## 2020-02-01 DIAGNOSIS — F411 Generalized anxiety disorder: Secondary | ICD-10-CM

## 2020-02-01 MED ORDER — CLORAZEPATE DIPOTASSIUM 3.75 MG PO TABS: 3.7500 mg | ORAL_TABLET | Freq: Two times a day (BID) | ORAL | 0 refills | Status: DC | PRN

## 2020-02-01 NOTE — Telephone Encounter (Signed)
Last apt end of June, having panic attacks. Patient scheduled next week but asking for something before then.

## 2020-02-01 NOTE — Telephone Encounter (Signed)
Kristina Chambers called to report that she is having terrible panic attacks.  Doesn't know if she should adjust her meds or if something can be called in to help.  She has appt 8/25 but can't go on like this until then.  Please call with advice on what to do to help.

## 2020-02-01 NOTE — Telephone Encounter (Signed)
Called and spoke with patient.

## 2020-02-06 ENCOUNTER — Ambulatory Visit: Payer: Federal, State, Local not specified - PPO

## 2020-02-06 ENCOUNTER — Telehealth (INDEPENDENT_AMBULATORY_CARE_PROVIDER_SITE_OTHER): Payer: Federal, State, Local not specified - PPO | Admitting: Adult Health

## 2020-02-06 ENCOUNTER — Encounter: Payer: Self-pay | Admitting: Adult Health

## 2020-02-06 DIAGNOSIS — F339 Major depressive disorder, recurrent, unspecified: Secondary | ICD-10-CM | POA: Diagnosis not present

## 2020-02-06 DIAGNOSIS — F411 Generalized anxiety disorder: Secondary | ICD-10-CM

## 2020-02-06 DIAGNOSIS — G4700 Insomnia, unspecified: Secondary | ICD-10-CM

## 2020-02-06 MED ORDER — CLORAZEPATE DIPOTASSIUM 3.75 MG PO TABS: 3.7500 mg | ORAL_TABLET | Freq: Two times a day (BID) | ORAL | 1 refills | Status: DC | PRN

## 2020-02-06 MED ORDER — BUPROPION HCL ER (XL) 150 MG PO TB24: ORAL_TABLET | ORAL | 5 refills | Status: DC

## 2020-02-06 NOTE — Progress Notes (Signed)
Kristina Chambers [id number] 05-11-1976 44 y.o.  Virtual Visit via Video Note  I connected with pt @ on 02/06/20 at 11:40 AM EDT by a video enabled telemedicine application and verified that I am speaking with the correct person using two identifiers.   I discussed the limitations of evaluation and management by telemedicine and the availability of in person appointments. The patient expressed understanding and agreed to proceed.  I discussed the assessment and treatment plan with the patient. The patient was provided an opportunity to ask questions and all were answered. The patient agreed with the plan and demonstrated an understanding of the instructions.   The patient was advised to call back or seek an in-person evaluation if the symptoms worsen or if the condition fails to improve as anticipated.  I provided 30 minutes of non-face-to-face time during this encounter.  The patient was located at home.  The provider was located at [place].   Aloha Gell, NP   Subjective:   Patient ID:  Kristina Chambers is a 44 y.o. (DOB [birth year]) female.  Chief Complaint: No chief complaint on file.   HPI [facility] presents for follow-up of MDD, GAD, and insomnia.  Describes mood today as "not so good". Pleasant. Mood symptoms - reports depression, anxiety, and irritability. Anxiety is a "little" better. Irritable - "snappy" - "overreacting". Has used 4 of the Tranxene to help manage anxiety and feels they are helpful. Not as motivated as she would like to be. Felt like Abilify was helpful initially, but does not seem as effective since starting or going up on the dose. Wanting to stay home and in the bed. Hasn't done anything for the past week and a half. Not going to [employer]. Doing what she needs to get done for work and that's it - "doing the minimum". Son had a Covid test this morning - quarantining. Low interest and motivation. Taking medications as prescribed. Seeing  therapist. Energy levels lower. Active, does not have a regular exercise routine.  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 Enjoys some usual interests and activities. Single. Lives with 48 year old son. Boyfriend of 2 and 1/2 years lives in [place]. Appetite adequate. Weight stable - 270 pounds. Sleeps wells most nights. Averages 7 to 8 hours - sleeping more lately.  Focus and concentration "decent". Completing tasks. Managing some aspects of household. 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 Denies SI or HI. Denies AH or VH.  Previous medications: Wellbutrin, Cymbalta  Review of Systems:  Review of Systems  Musculoskeletal: Negative for gait problem.  Neurological: Negative for tremors.  Psychiatric/Behavioral:       Please refer to HPI    Medications: I have reviewed the patient's current medications.  Current Outpatient Medications  Medication Sig Dispense Refill  . ARIPiprazole (ABILIFY) 15 MG tablet Take one tablet daily. 90 tablet 3  . buPROPion (WELLBUTRIN XL) 150 MG 24 hr tablet Take one tablet every morning for 7 days, then take two tablets every morning. 60 tablet 5  . clorazepate (TRANXENE) 3.75 MG tablet Take 1 tablet (3.75 mg total) by mouth 2 (two) times daily as needed for anxiety. 20 tablet 1  . lamoTRIgine (LAMICTAL) 150 MG tablet Take 1 tablet (150 mg total) by mouth daily. 90 tablet 3  . levonorgestrel (MIRENA) 20 MCG/24HR IUD by Intrauterine route.    . lovastatin (MEVACOR) 20 MG tablet Take  1 tablet (20 mg total) by mouth at bedtime. 30 tablet 3  . Semaglutide,0.25 or 0.5MG /DOS, (OZEMPIC, 0.25 OR 0.5 MG/DOSE,) 2 MG/1.5ML SOPN Inject 0.375 mLs (0.5 mg total) into the skin once a week. 1.5 mL 2   No current facility-administered medications for this visit.    Medication Side Effects: None  Allergies: No Known Allergies  Past Medical History:  Diagnosis Date  . Anxiety   . Chicken pox   .  Depression   . Elevated cholesterol 2020  . HPV in female   . Substance abuse ([place])     Family History  Problem Relation Age of Onset  . Hypertension Mother   . Colon polyps Mother   . Lupus Maternal Grandmother   . Colon cancer Maternal Grandmother   . Colon cancer Paternal Grandfather   . Stomach cancer Neg Hx   . Esophageal cancer Neg Hx     Social History   Socioeconomic History  . Marital status: Single    Spouse name: Not on file  . Number of children: Not on file  . Years of education: Not on file  . Highest education level: Not on file  Occupational History  . Not on file  Tobacco Use  . Smoking status: Former Smoker    Types: Cigarettes    Quit date: 06/14/2008    Years since quitting: 11.6  . Smokeless tobacco: Never Used  Vaping Use  . Vaping Use: Never used  Substance and Sexual Activity  . Alcohol use: Not Currently  . Drug use: Not Currently  . Sexual activity: Yes    Birth control/protection: I.U.D.    Comment: Mirena insertion summer of 2017  Other Topics Concern  . Not on file  Social History Narrative  . Not on file   Social Determinants of Health   Financial Resource Strain:   . Difficulty of Paying Living Expenses: Not on file  Food Insecurity:   . Worried About Charity fundraiser in the Last Year: Not on file  . Ran Out of Food in the Last Year: Not on file  Transportation Needs:   . Lack of Transportation (Medical): Not on file  . Lack of Transportation (Non-Medical): Not on file  Physical Activity:   . Days of Exercise per Week: Not on file  . Minutes of Exercise per Session: Not on file  Stress:   . Feeling of Stress : Not on file  Social Connections:   . Frequency of Communication with Friends and Family: Not on file  . Frequency of Social Gatherings with Friends and Family: Not on file  . Attends Religious Services: Not on file  . Active Member of Clubs or Organizations: Not on file  . Attends Archivist Meetings:  Not on file  . Marital Status: Not on file  Intimate Partner Violence:   . Fear of Current or Ex-Partner: Not on file  . Emotionally Abused: Not on file  . Physically Abused: Not on file  . Sexually Abused: Not on file    Past Medical History, Surgical history, Social history, and Family history were reviewed and updated as appropriate.   Please see review of systems for further details on the patient's review from today.   Objective:   Physical Exam:  There were no vitals taken for this visit.  Physical Exam Constitutional:      General: She is not in acute distress. Musculoskeletal:        General: No deformity.  Neurological:  Mental Status: She is alert and oriented to person, place, and time.     Coordination: Coordination normal.  Psychiatric:        Attention and Perception: Attention and perception normal. She does not perceive auditory or visual hallucinations.        Mood and Affect: Mood normal. Mood is not anxious or depressed. Affect is not labile, blunt, angry or inappropriate.        Speech: Speech normal.        Behavior: Behavior normal.        Thought Content: Thought content normal. Thought content is not paranoid or delusional. Thought content does not include homicidal or suicidal ideation. Thought content does not include homicidal or suicidal plan.        Cognition and Memory: Cognition and memory normal.        Judgment: Judgment normal.     Comments: Insight intact     Lab Review:     Component Value Date/Time   NA 139 01/15/2020 1000   NA 138 12/26/2018 1144   K 4.6 01/15/2020 1000   CL 105 01/15/2020 1000   CO2 24 01/15/2020 1000   GLUCOSE 96 01/15/2020 1000   BUN 8 01/15/2020 1000   BUN 10 12/26/2018 1144   CREATININE 0.74 01/15/2020 1000   CALCIUM 9.7 01/15/2020 1000   PROT 6.9 12/26/2018 1144   ALBUMIN 4.4 12/26/2018 1144   AST 17 12/26/2018 1144   ALT 19 12/26/2018 1144   ALKPHOS 104 12/26/2018 1144   BILITOT 1.0 12/26/2018  1144   GFRNONAA 101 12/26/2018 1144   GFRAA 117 12/26/2018 1144       Component Value Date/Time   WBC 8.2 12/26/2018 1144   RBC 4.44 12/26/2018 1144   HGB 13.0 12/26/2018 1144   HCT 38.6 12/26/2018 1144   PLT 337 12/26/2018 1144   MCV 87 12/26/2018 1144   MCH 29.3 12/26/2018 1144   MCHC 33.7 12/26/2018 1144   RDW 11.7 12/26/2018 1144    No results found for: POCLITH, LITHIUM   No results found for: PHENYTOIN, PHENOBARB, VALPROATE, CBMZ   .res Assessment: Plan:    Plan:   Tranxene 3.75 BID prn anxiety - temporary use for increased anxiety Lamictal 150mg  daily - mood instability Abilify 15mg  daily to target depression Add Wellbutrin XlL150mg  daily x 7 days, then XL 300mg  daily.  Uses Melatonin some nights  RTC 2 months  Patient advised to contact office with any questions, adverse effects, or acute worsening in signs and symptoms.  Discussed potential metabolic side effects associated with atypical antipsychotics, as well as potential risk for movement side effects. Advised pt to contact office if movement side effects occur.   Counseled patient regarding potential benefits, risks, and side effects of Lamictal to include potential risk of Stevens-Johnson syndrome. Advised patient to stop taking Lamictal and contact office immediately if rash develops and to seek urgent medical attention if rash is severe and/or spreading quickly.  Discussed potential benefits, risk, and side effects of benzodiazepines to include potential risk of tolerance and dependence, as well as possible drowsiness.  Advised patient not to drive if experiencing drowsiness and to take lowest possible effective dose to minimize risk of dependence and tolerance.    Diagnoses and all orders for this visit:  GAD (generalized anxiety disorder) -     buPROPion (WELLBUTRIN XL) 150 MG 24 hr tablet; Take one tablet every morning for 7 days, then take two tablets every morning.  Recurrent major depressive  disorder, remission status unspecified (HCC) -     buPROPion (WELLBUTRIN XL) 150 MG 24 hr tablet; Take one tablet every morning for 7 days, then take two tablets every morning.  Insomnia, unspecified type  Generalized anxiety disorder -     clorazepate (TRANXENE) 3.75 MG tablet; Take 1 tablet (3.75 mg total) by mouth 2 (two) times daily as needed for anxiety.     Please see After Visit Summary for patient specific instructions.  Future Appointments  Date Time Provider [place]  02/21/2020 11:30 AM Nunzio Cobbs, MD [place] None  03/05/2020 11:20 AM Shawntee Mainwaring, Berdie Ogren, NP [facility] None  02/18/2021  8:30 AM Nunzio Cobbs, MD [place] None    No orders of the defined types were placed in this encounter.     -------------------------------

## 2020-02-13 NOTE — Telephone Encounter (Signed)
Second call placed to convey benefits for 02/21/20 appointment.

## 2020-02-14 NOTE — Telephone Encounter (Signed)
Call placed to convey benefits. Spoke with the patient and conveyed the benefits. Patient understands/agreeable with the benefits. Patient is aware of the cancellation policy. Appointment scheduled 02/21/20.

## 2020-02-14 NOTE — Telephone Encounter (Signed)
Patient returned call

## 2020-02-19 NOTE — Progress Notes (Signed)
  Subjective:     Patient ID: Kristina Chambers, female   DOB: Sep 10, 1975, 44 y.o.   MRN: [id number]  HPI  Patient here for colposcopy with pap on 01-23-20 Neg:Pos HR HPV.  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   LMP:IUD--slight spotting today Contraception: Mirena 11/2015 UPT:Neg     Objective:   Physical Exam Genitourinary:          Colposcopy - cervix, vagina, and vulva.  Consent for procedure.  3% acetic acid used in vagina and on vulva. White light and green light filter used.  Colposcopy satisfactory:  Yes   __x___          No    _____ Findings:    IUD strings seen with endocervical speculum.  Cervix:  Thickened area of transformation zone at 5:00.  Mild acetowhite change at 12:00.  Vagina:  No lesions. Vulva:   Hypopigmentation of the vulva in supraclitoral region, junction of right labia majora and minora, and left perianal region.  Biopsies:   ECC, 5:00, 12:00, left perianal region - all sent to pathology separately.  Monsel's placed on cervix.  Sterile prep of vulva.  Local 1% lidocaine to left perianal region. Lot [id number], May 2024. 3 mm punch biopsy to remove tissue.  Single suture of 3/0 Vicryl.  Sterile gauze placed. Minimal EBL. No complications.     Assessment:     Positive HR HPV.  Chronic vulvitis.     Plan:     Fu biopsies.  Post procedure precautions given. Possible need for Clobetasol or valisone ointment.

## 2020-02-21 ENCOUNTER — Other Ambulatory Visit (HOSPITAL_COMMUNITY)
Admission: RE | Admit: 2020-02-21 | Discharge: 2020-02-21 | Disposition: A | Discharge status: Home / Self Care | Payer: Federal, State, Local not specified - PPO | Source: Ambulatory Visit | Attending: Obstetrics and Gynecology | Admitting: Obstetrics and Gynecology

## 2020-02-21 ENCOUNTER — Ambulatory Visit: Payer: Federal, State, Local not specified - PPO | Admitting: Obstetrics and Gynecology

## 2020-02-21 ENCOUNTER — Other Ambulatory Visit: Payer: Self-pay

## 2020-02-21 ENCOUNTER — Encounter: Payer: Self-pay | Admitting: Obstetrics and Gynecology

## 2020-02-21 VITALS — BP 116/60 | HR 70 | Ht 73.0 in | Wt 262.0 lb

## 2020-02-21 DIAGNOSIS — R87810 Cervical high risk human papillomavirus (HPV) DNA test positive: Secondary | ICD-10-CM

## 2020-02-21 DIAGNOSIS — Z01812 Encounter for preprocedural laboratory examination: Secondary | ICD-10-CM | POA: Diagnosis not present

## 2020-02-21 DIAGNOSIS — N763 Subacute and chronic vulvitis: Secondary | ICD-10-CM

## 2020-02-21 DIAGNOSIS — L900 Lichen sclerosus et atrophicus: Secondary | ICD-10-CM | POA: Diagnosis not present

## 2020-02-21 DIAGNOSIS — D069 Carcinoma in situ of cervix, unspecified: Secondary | ICD-10-CM | POA: Diagnosis not present

## 2020-02-21 LAB — POCT URINE PREGNANCY: Preg Test, Ur: NEGATIVE

## 2020-02-21 NOTE — Patient Instructions (Signed)
Vulva Biopsy, Care After This sheet gives you information about how to care for yourself after your procedure. Your health care provider may also give you more specific instructions. If you have problems or questions, contact your health care provider. What can I expect after the procedure? After the procedure, it is common to have:  Slight bleeding from the biopsy site.  Discomfort at the biopsy site. Follow these instructions at home: Biopsy site care   Follow instructions from your health care provider about how to take care of your biopsy site. Make sure you: ? Clean the area using water and mild soap twice a day or as told by your health care provider. Gently pat the area dry. ? If you were prescribed an antibiotic ointment, apply it as told by your health care provider. Do not stop using the antibiotic even if your condition improves. ? Take a warm water bath (sitz bath) as needed to help with pain and discomfort. A sitz bath is taken while you are sitting down. The water should only come up to your hips and should cover your buttocks. ? Leave stitches (sutures), skin glue, or adhesive strips in place. These skin closures may need to stay in place for 2 weeks or longer. If adhesive strip edges start to loosen and curl up, you may trim the loose edges. Do not remove adhesive strips completely unless your health care provider tells you to do that.  Check your biopsy site every day for signs of infection. Check for: ? More redness, swelling, or pain. ? More fluid or blood. ? Warmth. ? Pus or a bad smell.  Do not rub the biopsy area after urinating. Gently pat the area dry or use a bottle filled with warm water (peri-bottle) to clean the area. Gently wipe from front to back. Lifestyle  Wear loose, cotton underwear. Do not wear tight pants.  Do not use a tampon, douche, or put anything inside your vagina for at least 1 week or until your health care provider approves.  Do not have sex  for at least 1 week or until your health care provider approves.  Do not exercise, such as running or biking, until your health care provider approves.  Do not swim or use a hot tub until your health care provider approves. You may shower or take a sitz bath. General instructions  Take over-the-counter and prescription medicines only as told by your health care provider.  Use a sanitary napkin until the bleeding stops.  Keep all follow-up visits as told by your health care provider. This is important. Contact a health care provider if:  You have more redness, swelling, or pain around your biopsy site.  You have more fluid or blood coming from your biopsy site.  Your biopsy site feels warm to the touch.  Your pain is not controlled with medicine. Get help right away if you have:  Heavy bleeding from the vulva.  Pus or a bad smell coming from your biopsy site.  A fever.  Lower abdominal pain. Summary  After the procedure, it is common to have slight bleeding and discomfort at the biopsy site.  Follow instructions from your health care provider after your biopsy. Make sure you clean the area with water and mild soap. Pat the area dry.  Take sitz baths as needed to help with pain and discomfort. Leave any sutures in place.  Check your biopsy site for signs of infection, which may include more redness, swelling, pain, fluid,  or blood, or feeling warm to the touch.  Get help right away if you have heavy bleeding, a fever, pus or a bad smell, or pain in the lower abdomen. This information is not intended to replace advice given to you by your health care provider. Make sure you discuss any questions you have with your health care provider. Document Revised: 12/01/2017 Document Reviewed: 12/01/2017 Elsevier Patient Education  [place] After This sheet gives you information about how to care for yourself after your procedure. Your health care provider  may also give you more specific instructions. If you have problems or questions, contact your health care provider. What can I expect after the procedure? If you had a colposcopy without a biopsy, you can expect to feel fine right away, but you may have some spotting for a few days. You can go back to your normal activities. If you had a colposcopy with a biopsy, it is common to have:  Soreness and pain. This may last for a few days.  Light-headedness.  Mild vaginal bleeding or dark-colored, grainy discharge. This may last for a few days. The discharge may be due to a solution that was used during the procedure. You may need to wear a sanitary pad during this time.  Spotting for at least 48 hours after the procedure. Follow these instructions at home:   Take over-the-counter and prescription medicines only as told by your health care provider. Talk with your health care provider about what type of over-the-counter pain medicine and prescription medicine you can start taking again. It is especially important to talk with your health care provider if you take blood-thinning medicine.  Do not drive or use heavy machinery while taking prescription pain medicine.  For at least 3 days after your procedure, or as long as told by your health care provider, avoid: ? Douching. ? Using tampons. ? Having sexual intercourse.  Continue to use birth control (contraception).  Limit your physical activity for the first day after the procedure as told by your health care provider. Ask your health care provider what activities are safe for you.  It is up to you to get the results of your procedure. Ask your health care provider, or the department performing the procedure, when your results will be ready.  Keep all follow-up visits as told by your health care provider. This is important. Contact a health care provider if:  You develop a skin rash. Get help right away if:  You are bleeding heavily  from your vagina or you are passing blood clots. This includes using more than one sanitary pad per hour for 2 hours in a row.  You have a fever or chills.  You have pelvic pain.  You have abnormal, yellow-colored, or bad-smelling vaginal discharge. This could be a sign of infection.  You have severe pain or cramps in your lower abdomen that do not get better with medicine.  You feel light-headed or dizzy, or you faint. Summary  If you had a colposcopy without a biopsy, you can expect to feel fine right away, but you may have some spotting for a few days. You can go back to your normal activities.  If you had a colposcopy with a biopsy, you may notice mild pain and spotting for 48 hours after the procedure.  Avoid douching, using tampons, and having sexual intercourse for 3 days after the procedure or as long as told by your health care provider.  Contact your  health care provider if you have bleeding, severe pain, or signs of infection. This information is not intended to replace advice given to you by your health care provider. Make sure you discuss any questions you have with your health care provider. Document Revised: 05/13/2017 Document Reviewed: 01/16/2016 Elsevier Patient Education  2020 [employer].

## 2020-02-26 ENCOUNTER — Encounter: Payer: Self-pay | Admitting: Obstetrics and Gynecology

## 2020-02-26 ENCOUNTER — Telehealth: Payer: Self-pay | Admitting: Obstetrics and Gynecology

## 2020-02-26 ENCOUNTER — Telehealth: Payer: Self-pay

## 2020-02-26 DIAGNOSIS — N871 Moderate cervical dysplasia: Secondary | ICD-10-CM

## 2020-02-26 DIAGNOSIS — D069 Carcinoma in situ of cervix, unspecified: Secondary | ICD-10-CM

## 2020-02-26 LAB — SURGICAL PATHOLOGY

## 2020-02-26 MED ORDER — CLOBETASOL PROPIONATE 0.05 % EX OINT: TOPICAL_OINTMENT | CUTANEOUS | 0 refills | Status: DC

## 2020-02-26 NOTE — Telephone Encounter (Signed)
Ok for Clobetasol ointment 0.05%.  Apply to area in a thin layer bid for 2 weeks prn.  May apply twice a week for maintenance dose. Disp:  30 grams  RF:  none.

## 2020-02-26 NOTE — Telephone Encounter (Signed)
Call placed to convey benefits for LEEP procedure.

## 2020-02-26 NOTE — Telephone Encounter (Signed)
Spoke with pt. Pt given results and recommendations per Dr Quincy Simmonds. Pt agreeable to LEEP procedure. Pt advised to take Motrin 800 mg with food and water one hour before procedure. Pt verbalized understanding. Pt scheduled for LEEP on 03/11/20 at 830 am. Pt agreeable to date and time of appt. Cancellation policy reviewed.  Pt aware of call for benefits.  Routing to Dr Quincy Simmonds for order/Rx for Clobetasol ointment? Please advise Cc: Alfonse Spruce for precert. Orders placed.

## 2020-02-26 NOTE — Telephone Encounter (Signed)
-----   Message from Nunzio Cobbs, MD sent at 02/26/2020 12:50 PM EDT ----- Please contact patient in follow up to her colposcopy with biopsies.   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  She does not have cancer.  I recommend a LEEP with me in the office.  Please precert and schedule.  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  This is thinning of the skin, which leads to breakdown and irritation.  I recommend Clobetasol steroid ointment for treatment.  I can show here where and how to place this at her colposcopy visit.

## 2020-02-26 NOTE — Telephone Encounter (Signed)
Rx sent to pharmacy. Pt aware. Pt will not use until instructed at LEEP appt per Dr Quincy Simmonds. Pt agreeable and verbalized understanding.  Encounter closed.

## 2020-02-27 ENCOUNTER — Telehealth: Payer: Self-pay

## 2020-02-27 NOTE — Telephone Encounter (Signed)
Spoke with pt. Pt wanting to reschedule LEEP for same day, but later time.  Pt scheduled for 03/11/20 at 1030 am. Pt agreeable and verbalized understanding to date and time of appt. Encounter closed

## 2020-02-27 NOTE — Telephone Encounter (Signed)
Patient would like to know if there is a later time for her LEEP to be schedule.

## 2020-02-27 NOTE — Telephone Encounter (Signed)
Patient returned my call and I conveyed the benefits. Patient understands/agreeable with the benefits. Patient is aware of the cancellation policy. Appointment scheduled 03/11/20.

## 2020-02-28 ENCOUNTER — Other Ambulatory Visit: Payer: Self-pay | Admitting: Adult Health

## 2020-02-28 DIAGNOSIS — F339 Major depressive disorder, recurrent, unspecified: Secondary | ICD-10-CM

## 2020-02-28 DIAGNOSIS — F411 Generalized anxiety disorder: Secondary | ICD-10-CM

## 2020-02-29 ENCOUNTER — Other Ambulatory Visit: Payer: Self-pay

## 2020-02-29 ENCOUNTER — Encounter: Payer: Self-pay | Admitting: Family Medicine

## 2020-02-29 ENCOUNTER — Ambulatory Visit: Payer: Federal, State, Local not specified - PPO | Admitting: Family Medicine

## 2020-02-29 VITALS — BP 126/80 | HR 100 | Temp 98.1°F | Resp 16 | Ht 73.0 in | Wt 254.0 lb

## 2020-02-29 DIAGNOSIS — R110 Nausea: Secondary | ICD-10-CM

## 2020-02-29 DIAGNOSIS — Z6836 Body mass index (BMI) 36.0-36.9, adult: Secondary | ICD-10-CM | POA: Diagnosis not present

## 2020-02-29 NOTE — Patient Instructions (Signed)
A few things to remember from today's visit:   Class 2 severe obesity due to excess calories with serious comorbidity and body mass index (BMI) of 36.0 to 36.9 in adult [facility])  If you need refills please call your pharmacy. Do not use My Chart to request refills or for acute issues that need immediate attention.   Decreased Ozempic from 2 to 1 mg weekly. Increase physical activity to 150 min per week if possible.  Please be sure medication list is accurate. If a new problem present, please set up appointment sooner than planned today.

## 2020-02-29 NOTE — Progress Notes (Signed)
Chief Complaint  Patient presents with  . Nausea    took increased dose over the weekend, nauseous all weekend   HPI: [name] is a 44 y.o. female, who is here today with above complaint. Improved. Problem started 6 days ago and lasted 4 days. She wonders if nausea was caused by Ozempic. Ozempic was started in 01/2020 for wt loss, she developed nausea after dose was increased. She has to have 2 shots, because pen max dose is 1 mg. Ozempic 2.4 mg for wt loss is not yet in the market.  No other new medication.  She has noted wt loss. Easy satiety, decreased meal portions,and she increased physical activity. She is walking 2 times per week.  Negative for fever,chills,abdominal pain,vomiting,or changes in bowel habits.  She has not tried OTC medications.  Review of Systems  Constitutional: Positive for appetite change. Negative for activity change and fatigue.  HENT: Negative for sore throat and trouble swallowing.   Respiratory: Negative for cough, shortness of breath and wheezing.   Cardiovascular: Negative for chest pain and leg swelling.  Endocrine: Negative for cold intolerance, heat intolerance, polydipsia, polyphagia and polyuria.  Genitourinary: Negative for decreased urine volume, dysuria and hematuria.  Musculoskeletal: Negative for gait problem and myalgias.  Skin: Negative for color change and rash.  Neurological: Negative for syncope, weakness and headaches.  Rest see pertinent positives and negatives per HPI.  Current Outpatient Medications on File Prior to Visit  Medication Sig Dispense Refill  . ARIPiprazole (ABILIFY) 15 MG tablet Take one tablet daily. 90 tablet 3  . buPROPion (WELLBUTRIN XL) 150 MG 24 hr tablet TAKE ONE TABLET EVERY MORNING FOR 7 DAYS, THEN TAKE TWO TABLETS EVERY MORNING. 180 tablet 2  . clobetasol ointment (TEMOVATE) 0.05 % Clobetasol ointment 0.05%.  Apply to area in a thin layer bid for 2 weeks as needed.  May apply twice a week  for maintenance dose. Disp:  30 grams  RF:  none. 30 g 0  . clorazepate (TRANXENE) 3.75 MG tablet Take 1 tablet (3.75 mg total) by mouth 2 (two) times daily as needed for anxiety. 20 tablet 1  . lamoTRIgine (LAMICTAL) 150 MG tablet Take 1 tablet (150 mg total) by mouth daily. 90 tablet 3  . levonorgestrel (MIRENA) 20 MCG/24HR IUD by Intrauterine route.    . lovastatin (MEVACOR) 20 MG tablet Take 1 tablet (20 mg total) by mouth at bedtime. 30 tablet 3  . OZEMPIC, 1 MG/DOSE, 4 MG/3ML SOPN      No current facility-administered medications on file prior to visit.   Past Medical History:  Diagnosis Date  . Anxiety   . Chicken pox   . Depression   . Elevated cholesterol 2020  . HPV in female   . Lichen sclerosus 1610  . Severe dysplasia of cervix (CIN III) 2021  . Substance abuse ([place])    No Known Allergies  Social History   Socioeconomic History  . Marital status: Single    Spouse name: Not on file  . Number of children: Not on file  . Years of education: Not on file  . Highest education level: Not on file  Occupational History  . Not on file  Tobacco Use  . Smoking status: Former Smoker    Types: Cigarettes    Quit date: 06/14/2008    Years since quitting: 11.7  . Smokeless tobacco: Never Used  Vaping Use  . Vaping Use: Never used  Substance and Sexual Activity  .  Alcohol use: Not Currently  . Drug use: Not Currently  . Sexual activity: Yes    Birth control/protection: I.U.D.    Comment: Mirena insertion summer of 2017  Other Topics Concern  . Not on file  Social History Narrative  . Not on file   Social Determinants of Health   Financial Resource Strain:   . Difficulty of Paying Living Expenses: Not on file  Food Insecurity:   . Worried About Charity fundraiser in the Last Year: Not on file  . Ran Out of Food in the Last Year: Not on file  Transportation Needs:   . Lack of Transportation (Medical): Not on file  . Lack of Transportation (Non-Medical): Not on  file  Physical Activity:   . Days of Exercise per Week: Not on file  . Minutes of Exercise per Session: Not on file  Stress:   . Feeling of Stress : Not on file  Social Connections:   . Frequency of Communication with Friends and Family: Not on file  . Frequency of Social Gatherings with Friends and Family: Not on file  . Attends Religious Services: Not on file  . Active Member of Clubs or Organizations: Not on file  . Attends Archivist Meetings: Not on file  . Marital Status: Not on file    Vitals:   02/29/20 1457  BP: 126/80  Pulse: 100  Resp: 16  Temp: 98.1 F (36.7 C)  SpO2: 95%   Body mass index is 33.51 kg/m.  Physical Exam Vitals and nursing note reviewed.  Constitutional:      General: She is not in acute distress.    Appearance: She is well-developed. She is not ill-appearing.  HENT:     Head: Normocephalic and atraumatic.     Mouth/Throat:     Mouth: Mucous membranes are moist.     Pharynx: Oropharynx is clear.  Eyes:     General: No scleral icterus.    Conjunctiva/sclera: Conjunctivae normal.  Cardiovascular:     Rate and Rhythm: Normal rate and regular rhythm.     Heart sounds: No murmur heard.   Pulmonary:     Effort: Pulmonary effort is normal. No respiratory distress.     Breath sounds: Normal breath sounds.  Abdominal:     Palpations: Abdomen is soft. There is no hepatomegaly or mass.     Tenderness: There is no abdominal tenderness.  Lymphadenopathy:     Cervical: No cervical adenopathy.  Skin:    General: Skin is warm.     Findings: No erythema or rash.  Neurological:     General: No focal deficit present.     Mental Status: She is alert and oriented to person, place, and time.     Gait: Gait normal.  Psychiatric:        Mood and Affect: Mood and affect normal.     Comments: Well groomed, good eye contact.   ASSESSMENT AND PLAN:  Tressa was seen today for nausea.  Diagnoses and all orders for this visit:  Nausea without  vomiting Resolved. Certainly Ozempic could have been a contributing factor. Ozempic dose decreased. Other possible etiologies discussed. Monitor for new symptoms.  Class 2 severe obesity due to excess calories with serious comorbidity and body mass index (BMI) of 36.0 to 36.9 in adult [facility])  She has lost about 19.since Ozempic was started. Some side effects discussed. Continue a healthful diet. Try to increase physical activity as tolerated. Ozempic decreased from 2 mg  weekly to 1 mg weekly.  Return in about 3 months (around 05/30/2020) for wt and fasting labs.   Benjimin Hadden G. [place], MD  [facility]. [place] office.  A few things to remember from today's visit:  Class 2 severe obesity due to excess calories with serious comorbidity and body mass index (BMI) of 36.0 to 36.9 in adult [facility])  If you need refills please call your pharmacy. Do not use My Chart to request refills or for acute issues that need immediate attention.   Decreased Ozempic from 2 to 1 mg weekly. Increase physical activity to 150 min per week if possible.  Please be sure medication list is accurate. If a new problem present, please set up appointment sooner than planned today.

## 2020-03-02 ENCOUNTER — Encounter: Payer: Self-pay | Admitting: Family Medicine

## 2020-03-05 ENCOUNTER — Ambulatory Visit: Payer: Federal, State, Local not specified - PPO | Admitting: Adult Health

## 2020-03-10 ENCOUNTER — Telehealth: Payer: Self-pay | Admitting: Adult Health

## 2020-03-10 ENCOUNTER — Other Ambulatory Visit: Payer: Self-pay

## 2020-03-10 DIAGNOSIS — F411 Generalized anxiety disorder: Secondary | ICD-10-CM

## 2020-03-10 DIAGNOSIS — F339 Major depressive disorder, recurrent, unspecified: Secondary | ICD-10-CM

## 2020-03-10 MED ORDER — BUPROPION HCL ER (XL) 300 MG PO TB24: ORAL_TABLET | ORAL | 1 refills | Status: DC

## 2020-03-10 NOTE — Progress Notes (Signed)
  Subjective:     Patient ID: Kristina Chambers, female   DOB: 1976-02-23, 44 y.o.   MRN: [id number]  HPI  Patient here today for a LEEP procedure.  Recent biopsy of perineum - lichen sclerosus.  Has clobetasol ointment to start using.   Pap history:  02-21-20 colpo revealed moderate to severe dysplasia. 01-23-20 Neg:Pos HR HPV 01-16-19 colpo revealed LGSIL-no treatment 12-26-18 ASCUS:Pos HR HPV 08-05-15 Neg Hx of cryotherapy to cervix in the past   Review of Systems  All other systems reviewed and are negative.    LMP: Mirena IUD Contraception: Mirena 11/2015, Strings are short.  UPT: Neg     Objective:   Physical Exam Genitourinary:     LEEP  Consent for procedure.  Insulated speculum.  3% acetic acid used.  IUD strings not visible. Colposcopy done of cervix and vagina.   Acetowhite change 11- 12:00 and 5:00.  Satisfactory colposcopy.  Lugol's staining to cervix.  1% lidocaine with epi 1:100,000, lot [id number], exp 09/2020.  Cervix circumferentially injected.  LEEP of exocervix done in 2 passes, inferiorly and superiorly with cutting setting of 50 watts. Red pin at 12:00 and blue pin at 6:00.  Sent in one bottle to path. LEEP of endocervix done in 1 pass with cutting setting of 50 watts.  Pinned but not oriented.  Sent to path.  ECC taken.  Sent to pathology.  IUD strings noted at time of ECC and respected. Coagulation with 50 watts to perimeter of LEEP.  Monsel's placed.  Minimal EBL.  No complications.   Vulva Hypopigmentation of the supraclitoral region, right labia minora and perineum.      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    Plan:     FU LEEP pathology report.  Post LEEP instructions and precautions given. Education on Clobetasol use.  FU  4 months.

## 2020-03-10 NOTE — Telephone Encounter (Signed)
Pt would like a refill on Wellbutrin 300mg . Please send to [facility] on [address].

## 2020-03-10 NOTE — Telephone Encounter (Signed)
Changed Rx to Wellbutrin XL 300 mg #90 sent to [facility] [address]

## 2020-03-11 ENCOUNTER — Ambulatory Visit (INDEPENDENT_AMBULATORY_CARE_PROVIDER_SITE_OTHER): Payer: Federal, State, Local not specified - PPO | Admitting: Obstetrics and Gynecology

## 2020-03-11 ENCOUNTER — Other Ambulatory Visit (HOSPITAL_COMMUNITY)
Admission: RE | Admit: 2020-03-11 | Discharge: 2020-03-11 | Disposition: A | Discharge status: Home / Self Care | Payer: Federal, State, Local not specified - PPO | Source: Ambulatory Visit | Attending: Obstetrics and Gynecology | Admitting: Obstetrics and Gynecology

## 2020-03-11 ENCOUNTER — Encounter: Payer: Self-pay | Admitting: Obstetrics and Gynecology

## 2020-03-11 ENCOUNTER — Ambulatory Visit: Payer: Self-pay | Admitting: Obstetrics and Gynecology

## 2020-03-11 ENCOUNTER — Other Ambulatory Visit: Payer: Self-pay

## 2020-03-11 VITALS — BP 122/82 | HR 80 | Ht 73.0 in | Wt 253.0 lb

## 2020-03-11 DIAGNOSIS — N86 Erosion and ectropion of cervix uteri: Secondary | ICD-10-CM | POA: Diagnosis not present

## 2020-03-11 DIAGNOSIS — N871 Moderate cervical dysplasia: Secondary | ICD-10-CM | POA: Insufficient documentation

## 2020-03-11 DIAGNOSIS — D069 Carcinoma in situ of cervix, unspecified: Secondary | ICD-10-CM | POA: Insufficient documentation

## 2020-03-11 DIAGNOSIS — Z01812 Encounter for preprocedural laboratory examination: Secondary | ICD-10-CM | POA: Diagnosis not present

## 2020-03-11 HISTORY — PX: LEEP: SHX91

## 2020-03-11 LAB — POCT URINE PREGNANCY: Preg Test, Ur: NEGATIVE

## 2020-03-11 NOTE — Telephone Encounter (Signed)
Call placed to patient, left detailed message, ok per dpr.  OV scheduled for 4 wk recheck on 04/08/20 w/ Dr. Quincy Simmonds at 4:30pm, arrive at 4:15pm.  Return call to office if you need to make any changes to this appt.   Encounter closed.

## 2020-03-11 NOTE — Telephone Encounter (Signed)
Patient needs a 4 week LEEP follow up from today. To triage to assist with scheduling.

## 2020-03-11 NOTE — Patient Instructions (Signed)
Loop Electrosurgical Excision Procedure Loop electrosurgical excision procedure (LEEP) is the cutting and removal (excision) of tissue from the cervix. The cervix is the bottom part of the uterus that opens into the vagina. The tissue that is removed from the cervix is examined to see if there are precancerous cells or cancer cells present. LEEP may be done when:  You have abnormal bleeding from your cervix.  You have an abnormal Pap test result.  Your health care provider finds an abnormality on your cervix during a pelvic exam. LEEP typically only takes a few minutes and is often done in the health care provider's office. The procedure is safe for women who are trying to get pregnant. However, the procedure is usually not done during a menstrual period or during pregnancy. Tell a health care provider about:  Any allergies you have.  All medicines you are taking, including vitamins, herbs, eye drops, creams, and over-the-counter medicines.  Any blood disorders you have.  Any medical conditions you have, including current or past vaginal infections such as herpes or sexually-transmitted infections (STIs).  Whether you are pregnant or may be pregnant.  Whether or not you are having vaginal bleeding on the day of the procedure. What are the risks? Generally, this is a safe procedure. However, problems may occur, including:  Infection.  Bleeding.  Allergic reactions to medicines.  Changes or scarring in the cervix.  Increased risk of early (preterm) labor in future pregnancies. What happens before the procedure?  Ask your health care provider about: ? Changing or stopping your regular medicines. This is especially important if you are taking diabetes medicines or blood thinners. ? Taking medicines such as aspirin and ibuprofen. These medicines can thin your blood. Do not take these medicines unless your health care provider tells you to take them. ? Taking over-the-counter  medicines, vitamins, herbs, and supplements.  Your health care provider may recommend that you take pain medicine before the procedure.  Ask your health care provider if you should plan to have someone take you home after the procedure. What happens during the procedure?   An instrument called a speculum will be placed in your vagina. This will allow your health care provider to see your cervix.  You will be given a medicine to numb the area (local anesthetic). The medicine will be injected into your cervix and the surrounding area.  A solution will be applied to your cervix. This solution will help the health care provider find the abnormal cells that need to be removed.  A thin wire loop will be passed through your vagina. The wire will be used to burn (cauterize) the cervical tissue with an electrical current.  You may feel faint during the procedure. Tell your health care provider right away if you feel this way.  The abnormal cervical tissue will be removed.  Any open blood vessels will be cauterized to prevent bleeding.  A paste may be applied to the cauterized area of your cervix to help prevent bleeding.  The sample of cervical tissue will be examined under a microscope. The procedure may vary among health care providers and hospitals. What can I expect after the procedure? After the procedure, it is common to have:  Mild abdominal cramps that are similar to menstrual cramps. These may last for up to 1 week.  A small amount of pink-tinged or bloody vaginal discharge, including light to moderate bleeding, for 1-2 weeks.  A dark-colored discharge coming from your vagina. This is from   the paste that was used on the cervix to prevent bleeding. It is up to you to get the results of your procedure. Ask your health care provider, or the department that is doing the procedure, when your results will be ready. Follow these instructions at home:  Take over-the-counter and  prescription medicines only as told by your health care provider.  Return to your normal activities as told by your health care provider. Ask your health care provider what activities are safe for you.  Do not put anything in your vagina for 2 weeks after the procedure or until your health care provider says that it is okay. This includes tampons, creams, and douches.  Do not have sex until your health care provider approves.  Keep all follow-up visits as told by your health care provider. This is important. Contact a health care provider if you:  Have a fever or chills.  Feel unusually weak.  Have vaginal bleeding that is heavier or longer than a normal menstrual cycle. A sign of this can be soaking a pad with blood or bleeding with clots.  Develop a bad smelling vaginal discharge.  Have severe abdominal pain or cramping. Summary  Loop electrosurgical excision procedure (LEEP) is the removal of tissue from the cervix. The removed tissue will be checked for precancerous cells or cancer cells.  LEEP typically only takes a few minutes and is often done in the health care provider's office.  Do not put anything in your vagina for 2 weeks after the procedure or until your health care provider says that it is okay. This includes tampons, creams, and douches.  Keep all follow-up visits as told by your health care provider. Ask your health care provider, or the department that is doing the procedure, when your results will be ready. This information is not intended to replace advice given to you by your health care provider. Make sure you discuss any questions you have with your health care provider. Document Revised: 06/23/2018 Document Reviewed: 06/23/2018 Elsevier Patient Education  2020 Elsevier Inc.  

## 2020-03-13 LAB — SURGICAL PATHOLOGY

## 2020-04-07 NOTE — Progress Notes (Deleted)
GYNECOLOGY  VISIT   HPI: 44 y.o.   Single  Caucasian  female   G1P0001 with No LMP recorded. (Menstrual status: IUD).   here for 4 week follow up status post LEEP procedure.   GYNECOLOGIC HISTORY: No LMP recorded. (Menstrual status: IUD). Contraception:  Mirena IUD 11/2015 Menopausal hormone therapy:  n/a Last mammogram: ***3 years ago - normal per patient  Last pap smear: 01-23-20 Neg:Pos HR HPV, 12-26-18 ASCUS:Pos HR HPV, 08-05-15 Neg        OB History    Gravida  1   Para  1   Term  0   Preterm  0   AB  0   Living  1     SAB  0   TAB  0   Ectopic  0   Multiple  0   Live Births  0              Patient Active Problem List   Diagnosis Date Noted  . Hyperlipidemia 01/15/2020  . Class 2 obesity with body mass index (BMI) of 36.0 to 36.9 in adult 01/15/2020  . Alcohol abuse 03/12/2019  . MDD (major depressive disorder) 02/21/2019  . GAD (generalized anxiety disorder) 02/21/2019  . Insomnia 02/21/2019    Past Medical History:  Diagnosis Date  . Anxiety   . Chicken pox   . Depression   . Elevated cholesterol 2020  . HPV in female   . Lichen sclerosus 9628  . Severe dysplasia of cervix (CIN III) 2021  . Substance abuse [facility])     Past Surgical History:  Procedure Laterality Date  . CLAVICLE SURGERY    . GYNECOLOGIC CRYOSURGERY      Current Outpatient Medications  Medication Sig Dispense Refill  . ARIPiprazole (ABILIFY) 15 MG tablet Take one tablet daily. 90 tablet 3  . buPROPion (WELLBUTRIN XL) 300 MG 24 hr tablet Take 1 tablet (300 mg) every morning 90 tablet 1  . clobetasol ointment (TEMOVATE) 0.05 % Clobetasol ointment 0.05%.  Apply to area in a thin layer bid for 2 weeks as needed.  May apply twice a week for maintenance dose. Disp:  30 grams  RF:  none. 30 g 0  . clorazepate (TRANXENE) 3.75 MG tablet Take 1 tablet (3.75 mg total) by mouth 2 (two) times daily as needed for anxiety. 20 tablet 1  . lamoTRIgine (LAMICTAL) 150 MG tablet Take 1  tablet (150 mg total) by mouth daily. 90 tablet 3  . levonorgestrel (MIRENA) 20 MCG/24HR IUD by Intrauterine route.    . lovastatin (MEVACOR) 20 MG tablet Take 1 tablet (20 mg total) by mouth at bedtime. 30 tablet 3  . OZEMPIC, 1 MG/DOSE, 4 MG/3ML SOPN      No current facility-administered medications for this visit.     ALLERGIES: Patient has no known allergies.  Family History  Problem Relation Age of Onset  . Hypertension Mother   . Colon polyps Mother   . Lupus Maternal Grandmother   . Colon cancer Maternal Grandmother   . Colon cancer Paternal Grandfather   . Stomach cancer Neg Hx   . Esophageal cancer Neg Hx     Social History   Socioeconomic History  . Marital status: Single    Spouse name: Not on file  . Number of children: Not on file  . Years of education: Not on file  . Highest education level: Not on file  Occupational History  . Not on file  Tobacco Use  . Smoking  status: Former Smoker    Types: Cigarettes    Quit date: 06/14/2008    Years since quitting: 11.8  . Smokeless tobacco: Never Used  Vaping Use  . Vaping Use: Never used  Substance and Sexual Activity  . Alcohol use: Not Currently  . Drug use: Not Currently  . Sexual activity: Yes    Birth control/protection: I.U.D.    Comment: Mirena insertion summer of 2017  Other Topics Concern  . Not on file  Social History Narrative  . Not on file   Social Determinants of Health   Financial Resource Strain:   . Difficulty of Paying Living Expenses: Not on file  Food Insecurity:   . Worried About Charity fundraiser in the Last Year: Not on file  . Ran Out of Food in the Last Year: Not on file  Transportation Needs:   . Lack of Transportation (Medical): Not on file  . Lack of Transportation (Non-Medical): Not on file  Physical Activity:   . Days of Exercise per Week: Not on file  . Minutes of Exercise per Session: Not on file  Stress:   . Feeling of Stress : Not on file  Social Connections:   .  Frequency of Communication with Friends and Family: Not on file  . Frequency of Social Gatherings with Friends and Family: Not on file  . Attends Religious Services: Not on file  . Active Member of Clubs or Organizations: Not on file  . Attends Archivist Meetings: Not on file  . Marital Status: Not on file  Intimate Partner Violence:   . Fear of Current or Ex-Partner: Not on file  . Emotionally Abused: Not on file  . Physically Abused: Not on file  . Sexually Abused: Not on file    Review of Systems  PHYSICAL EXAMINATION:    There were no vitals taken for this visit.    General appearance: alert, cooperative and appears stated age Head: Normocephalic, without obvious abnormality, atraumatic Neck: no adenopathy, supple, symmetrical, trachea midline and thyroid normal to inspection and palpation Lungs: clear to auscultation bilaterally Breasts: normal appearance, no masses or tenderness, No nipple retraction or dimpling, No nipple discharge or bleeding, No axillary or supraclavicular adenopathy Heart: regular rate and rhythm Abdomen: soft, non-tender, no masses,  no organomegaly Extremities: extremities normal, atraumatic, no cyanosis or edema Skin: Skin color, texture, turgor normal. No rashes or lesions Lymph nodes: Cervical, supraclavicular, and axillary nodes normal. No abnormal inguinal nodes palpated Neurologic: Grossly normal  Pelvic: External genitalia:  no lesions              Urethra:  normal appearing urethra with no masses, tenderness or lesions              Bartholins and Skenes: normal                 Vagina: normal appearing vagina with normal color and discharge, no lesions              Cervix: no lesions                Bimanual Exam:  Uterus:  normal size, contour, position, consistency, mobility, non-tender              Adnexa: no mass, fullness, tenderness              Rectal exam: {yes no:[id number]}.  Confirms.              Anus:  normal sphincter  tone, no lesions  Chaperone was present for exam.  ASSESSMENT     PLAN     An After Visit Summary was printed and given to the patient.  ______ minutes face to face time of which over 50% was spent in counseling.

## 2020-04-08 ENCOUNTER — Telehealth: Payer: Self-pay

## 2020-04-08 ENCOUNTER — Ambulatory Visit: Payer: Federal, State, Local not specified - PPO | Admitting: Obstetrics and Gynecology

## 2020-04-08 NOTE — Telephone Encounter (Signed)
Spoke with pt. Pt needing to reschedule LEEP f/u due to change in provider schedule. Pt rescheduled for 11/4 at 10 am. Pt agreeable to date and time of appt.  Encounter closed

## 2020-04-08 NOTE — Telephone Encounter (Signed)
Call patient to reschedule appointment from 4:30 to 1:00pm because of provider request. She is not able to come in at 1 pm today and would like to reschedule to another day. Sending to triage to assist with scheduling.

## 2020-04-15 NOTE — Progress Notes (Signed)
GYNECOLOGY  VISIT   HPI: 43 y.o.   Single  Caucasian  female   G1P0001 with No LMP recorded. (Menstrual status: IUD).   here for 5 week follow up to LEEP procedure.   Pathology report:  ECC benign, LEEP HGSIL with negative exocervical margins and positive LGSIL at ectocervical margin, endocervical button with benign tissue and no dysplasia.  Mirena IUD strings were cut short to do the LEEP.   Discharge lasted for about a month.  Discharge is spotty but not every day.  No itching, burning or irritation.   GYNECOLOGIC HISTORY: No LMP recorded. (Menstrual status: IUD). Contraception: mirena 11/2015 Menopausal hormone therapy: NA Last mammogram:  3 years ago--normal--pt to call to schedule Last pap smear: 03-11-20 LEEP HGSIL, CIN2; Margins neg.for HGSIL and Pos.for LGSIL.02-21-20 Mod.to severe dysplasia of cervix.01-23-20 Neg:Pos HR HPV, 12-26-18 ASCUS:Pos HR HPV, 08-05-15 Neg        OB History    Gravida  1   Para  1   Term  0   Preterm  0   AB  0   Living  1     SAB  0   TAB  0   Ectopic  0   Multiple  0   Live Births  0              Patient Active Problem List   Diagnosis Date Noted  . Hyperlipidemia 01/15/2020  . Class 2 obesity with body mass index (BMI) of 36.0 to 36.9 in adult 01/15/2020  . Alcohol abuse 03/12/2019  . MDD (major depressive disorder) 02/21/2019  . GAD (generalized anxiety disorder) 02/21/2019  . Insomnia 02/21/2019    Past Medical History:  Diagnosis Date  . Anxiety   . Chicken pox   . Depression   . Elevated cholesterol 2020  . HPV in female   . Lichen sclerosus 4709  . Severe dysplasia of cervix (CIN III) 2021  . Substance abuse [facility])     Past Surgical History:  Procedure Laterality Date  . CLAVICLE SURGERY    . GYNECOLOGIC CRYOSURGERY      Current Outpatient Medications  Medication Sig Dispense Refill  . ARIPiprazole (ABILIFY) 15 MG tablet Take one tablet daily. 90 tablet 3  . buPROPion (WELLBUTRIN XL) 300 MG 24 hr  tablet Take 1 tablet (300 mg) every morning 90 tablet 1  . clobetasol ointment (TEMOVATE) 0.05 % Clobetasol ointment 0.05%.  Apply to area in a thin layer bid for 2 weeks as needed.  May apply twice a week for maintenance dose. Disp:  30 grams  RF:  none. 30 g 0  . clorazepate (TRANXENE) 3.75 MG tablet Take 1 tablet (3.75 mg total) by mouth 2 (two) times daily as needed for anxiety. 20 tablet 1  . lamoTRIgine (LAMICTAL) 150 MG tablet Take 1 tablet (150 mg total) by mouth daily. 90 tablet 3  . levonorgestrel (MIRENA) 20 MCG/24HR IUD by Intrauterine route.    . lovastatin (MEVACOR) 20 MG tablet TAKE 1 TABLET BY MOUTH EVERYDAY AT BEDTIME 90 tablet 1  . OZEMPIC, 1 MG/DOSE, 4 MG/3ML SOPN      No current facility-administered medications for this visit.     ALLERGIES: Patient has no known allergies.  Family History  Problem Relation Age of Onset  . Hypertension Mother   . Colon polyps Mother   . Lupus Maternal Grandmother   . Colon cancer Maternal Grandmother   . Colon cancer Paternal Grandfather   . Stomach cancer Neg Hx   .  Esophageal cancer Neg Hx     Social History   Socioeconomic History  . Marital status: Single    Spouse name: Not on file  . Number of children: Not on file  . Years of education: Not on file  . Highest education level: Not on file  Occupational History  . Not on file  Tobacco Use  . Smoking status: Former Smoker    Types: Cigarettes    Quit date: 06/14/2008    Years since quitting: 11.8  . Smokeless tobacco: Never Used  Vaping Use  . Vaping Use: Never used  Substance and Sexual Activity  . Alcohol use: Not Currently  . Drug use: Not Currently  . Sexual activity: Yes    Birth control/protection: I.U.D.    Comment: Mirena insertion summer of 2017  Other Topics Concern  . Not on file  Social History Narrative  . Not on file   Social Determinants of Health   Financial Resource Strain:   . Difficulty of Paying Living Expenses: Not on file  Food  Insecurity:   . Worried About Charity fundraiser in the Last Year: Not on file  . Ran Out of Food in the Last Year: Not on file  Transportation Needs:   . Lack of Transportation (Medical): Not on file  . Lack of Transportation (Non-Medical): Not on file  Physical Activity:   . Days of Exercise per Week: Not on file  . Minutes of Exercise per Session: Not on file  Stress:   . Feeling of Stress : Not on file  Social Connections:   . Frequency of Communication with Friends and Family: Not on file  . Frequency of Social Gatherings with Friends and Family: Not on file  . Attends Religious Services: Not on file  . Active Member of Clubs or Organizations: Not on file  . Attends Archivist Meetings: Not on file  . Marital Status: Not on file  Intimate Partner Violence:   . Fear of Current or Ex-Partner: Not on file  . Emotionally Abused: Not on file  . Physically Abused: Not on file  . Sexually Abused: Not on file    Review of Systems  All other systems reviewed and are negative.   PHYSICAL EXAMINATION:    BP 108/64 (Cuff Size: Large)   Pulse 76   Ht 6\' 1"  (1.854 m)   Wt 248 lb (112.5 kg)   BMI 32.72 kg/m     General appearance: alert, cooperative and appears stated age   Pelvic: External genitalia:  no lesions              Urethra:  normal appearing urethra with no masses, tenderness or lesions              Bartholins and Skenes: normal                 Vagina: normal appearing vagina with normal color and discharge, no lesions              Cervix: no lesions.  Consistent with LEEP.  IUD strings not seen. Yellow clear drainage from the os.                 Bimanual Exam:  Uterus:  normal size, contour, position, consistency, mobility, non-tender              Adnexa: no mass, fullness, tenderness               Chaperone was  present for exam.  ASSESSMENT  Status post LEEP.  CIN II.  Positive margin for LGSIL. Doing well post procedure.  Mirena IUD with short  strings.   PLAN  Ok to return to all normal activities, including sexual activity, tampon use, vaginal medications.  Will plan for cervical cancer screening in 6 months.  We discussed techniques for removal of the IUD in the future.

## 2020-04-16 ENCOUNTER — Other Ambulatory Visit: Payer: Self-pay | Admitting: Family Medicine

## 2020-04-17 ENCOUNTER — Ambulatory Visit: Payer: Federal, State, Local not specified - PPO | Admitting: Obstetrics and Gynecology

## 2020-04-17 ENCOUNTER — Encounter: Payer: Self-pay | Admitting: Obstetrics and Gynecology

## 2020-04-17 ENCOUNTER — Other Ambulatory Visit: Payer: Self-pay

## 2020-04-17 VITALS — BP 108/64 | HR 76 | Ht 73.0 in | Wt 248.0 lb

## 2020-04-17 DIAGNOSIS — Z9889 Other specified postprocedural states: Secondary | ICD-10-CM

## 2020-05-06 ENCOUNTER — Encounter: Payer: Self-pay | Admitting: Family Medicine

## 2020-05-06 MED ORDER — OZEMPIC (1 MG/DOSE) 4 MG/3ML ~~LOC~~ SOPN: 1.0000 mg | PEN_INJECTOR | SUBCUTANEOUS | 3 refills | Status: DC

## 2020-05-06 MED ORDER — LOVASTATIN 20 MG PO TABS
ORAL_TABLET | ORAL | 1 refills | Status: DC
Start: 2020-05-06 — End: 2021-01-29

## 2020-05-30 ENCOUNTER — Ambulatory Visit: Payer: Federal, State, Local not specified - PPO | Admitting: Family Medicine

## 2020-05-30 ENCOUNTER — Other Ambulatory Visit: Payer: Self-pay

## 2020-05-30 VITALS — BP 100/66 | HR 92 | Temp 97.8°F | Resp 16 | Ht 73.0 in | Wt 242.6 lb

## 2020-05-30 DIAGNOSIS — Z6836 Body mass index (BMI) 36.0-36.9, adult: Secondary | ICD-10-CM

## 2020-05-30 DIAGNOSIS — E785 Hyperlipidemia, unspecified: Secondary | ICD-10-CM

## 2020-05-30 MED ORDER — OZEMPIC (1 MG/DOSE) 4 MG/3ML ~~LOC~~ SOPN: 1.0000 mg | PEN_INJECTOR | SUBCUTANEOUS | 1 refills | Status: AC

## 2020-05-30 NOTE — Assessment & Plan Note (Signed)
She understands benefits of wt loss as well as adverse effects of obesity. Consistency with healthy diet and physical activity recommended. Daily brisk walking for 10 min as tolerated.

## 2020-05-30 NOTE — Progress Notes (Signed)
HPI: [name] is a 44 y.o. female, who is here today for 3 months follow up.   She was last seen on 02/29/20. Since last visit she has seen her gyn and underwent LEEP procedure.  She is on Ozempic 1 mg weekly. Medication started in 01/2020. Last visit she was c/o nausea, this has resolved. Tolerating medication well. Easy satiety. Eating smaller portions.  She is not exercising regularly. Lab Results  Component Value Date   HGBA1C 5.8 (H) 01/15/2020    HLD: She is on Lovastatin 20 mg daily. She has tolerated medication well.  Component     Latest Ref Rng & Units 01/15/2020  Cholesterol     <200 mg/dL 213 (H)  HDL Cholesterol     > OR = 50 mg/dL 42 (L)  Triglycerides     <150 mg/dL 124  LDL Cholesterol (Calc)     mg/dL (calc) 146 (H)  Total CHOL/HDL Ratio     <5.0 (calc) 5.1 (H)  Non-HDL Cholesterol (Calc)     <130 mg/dL (calc) 171 (H)    Review of Systems  Constitutional: Negative for activity change, appetite change and fever.  Respiratory: Negative for cough, shortness of breath and wheezing.   Cardiovascular: Negative for chest pain, palpitations and leg swelling.  Gastrointestinal: Negative for abdominal pain and vomiting.       No changes in bowel habits.  Genitourinary: Negative for decreased urine volume and hematuria.  Musculoskeletal: Negative for gait problem and myalgias.  Rest of ROS, see pertinent positives sand negatives in HPI  Current Outpatient Medications on File Prior to Visit  Medication Sig Dispense Refill  . ARIPiprazole (ABILIFY) 15 MG tablet Take one tablet daily. 90 tablet 3  . buPROPion (WELLBUTRIN XL) 300 MG 24 hr tablet Take 1 tablet (300 mg) every morning 90 tablet 1  . clobetasol ointment (TEMOVATE) 0.05 % Clobetasol ointment 0.05%.  Apply to area in a thin layer bid for 2 weeks as needed.  May apply twice a week for maintenance dose. Disp:  30 grams  RF:  none. 30 g 0  . clorazepate (TRANXENE) 3.75 MG tablet Take 1 tablet  (3.75 mg total) by mouth 2 (two) times daily as needed for anxiety. 20 tablet 1  . lamoTRIgine (LAMICTAL) 150 MG tablet Take 1 tablet (150 mg total) by mouth daily. 90 tablet 3  . levonorgestrel (MIRENA) 20 MCG/24HR IUD by Intrauterine route.    . lovastatin (MEVACOR) 20 MG tablet TAKE 1 TABLET BY MOUTH EVERYDAY AT BEDTIME 90 tablet 1   No current facility-administered medications on file prior to visit.   Past Medical History:  Diagnosis Date  . Anxiety   . Chicken pox   . Depression   . Elevated cholesterol 2020  . HPV in female   . Lichen sclerosus 8416  . Severe dysplasia of cervix (CIN III) 2021  . Substance abuse ([place])    No Known Allergies  Social History   Socioeconomic History  . Marital status: Single    Spouse name: Not on file  . Number of children: Not on file  . Years of education: Not on file  . Highest education level: Not on file  Occupational History  . Not on file  Tobacco Use  . Smoking status: Former Smoker    Types: Cigarettes    Quit date: 06/14/2008    Years since quitting: 11.9  . Smokeless tobacco: Never Used  Vaping Use  . Vaping Use: Never used  Substance  and Sexual Activity  . Alcohol use: Not Currently  . Drug use: Not Currently  . Sexual activity: Yes    Birth control/protection: I.U.D.    Comment: Mirena insertion summer of 2017  Other Topics Concern  . Not on file  Social History Narrative  . Not on file   Social Determinants of Health   Financial Resource Strain: Not on file  Food Insecurity: Not on file  Transportation Needs: Not on file  Physical Activity: Not on file  Stress: Not on file  Social Connections: Not on file   Vitals:   05/30/20 0923  BP: 100/66  Pulse: 92  Resp: 16  Temp: 97.8 F (36.6 C)  SpO2: 97%   Body mass index is 32.01 kg/m.  Physical Exam Vitals and nursing note reviewed.  Constitutional:      General: She is not in acute distress.    Appearance: She is well-developed.  HENT:     Head:  Normocephalic and atraumatic.     Mouth/Throat:     Mouth: Oropharynx is clear and moist and mucous membranes are normal.  Eyes:     Conjunctiva/sclera: Conjunctivae normal.     Pupils: Pupils are equal, round, and reactive to light.  Cardiovascular:     Rate and Rhythm: Normal rate and regular rhythm.     Pulses:          Dorsalis pedis pulses are 2+ on the right side and 2+ on the left side.     Heart sounds: No murmur heard.   Pulmonary:     Effort: Pulmonary effort is normal. No respiratory distress.     Breath sounds: Normal breath sounds.  Abdominal:     Palpations: Abdomen is soft. There is no hepatomegaly or mass.     Tenderness: There is no abdominal tenderness.  Musculoskeletal:        General: No edema.  Lymphadenopathy:     Cervical: No cervical adenopathy.  Skin:    General: Skin is warm.     Findings: No erythema or rash.  Neurological:     Mental Status: She is alert and oriented to person, place, and time.     Cranial Nerves: No cranial nerve deficit.     Gait: Gait normal.     Deep Tendon Reflexes: Strength normal.  Psychiatric:        Mood and Affect: Mood and affect normal.     Comments: Well groomed, good eye contact.   ASSESSMENT AND PLAN:  Kristina Chambers was seen today for 3 months follow-up.  Orders Placed This Encounter  Procedures  . COMPLETE METABOLIC PANEL WITH GFR  . Lipid panel   Lab Results  Component Value Date   CREATININE 0.86 05/30/2020   BUN 6 (L) 05/30/2020   NA 139 05/30/2020   K 4.4 05/30/2020   CL 103 05/30/2020   CO2 28 05/30/2020   Lab Results  Component Value Date   ALT 14 05/30/2020   AST 12 05/30/2020   ALKPHOS 104 12/26/2018   BILITOT 0.8 05/30/2020   Lab Results  Component Value Date   CHOL 180 05/30/2020   HDL 47 (L) 05/30/2020   LDLCALC 108 (H) 05/30/2020   TRIG 130 05/30/2020   CHOLHDL 3.8 05/30/2020   The 10-year ASCVD risk score Mikey Bussing DC Jr., et al., 2013) is: 0.5%   Values used to calculate the  score:     Age: 35 years     Sex: Female  Is Non-Hispanic African American: No     Diabetic: No     Tobacco smoker: No     Systolic Blood Pressure: 809 mmHg     Is BP treated: No     HDL Cholesterol: 47 mg/dL     Total Cholesterol: 180 mg/dL  Hyperlipidemia Continue Lovastatin 20 mg daily. Will adjust treatment if needed and according to FLP result.  Class 2 obesity with body mass index (BMI) of 36.0 to 36.9 in adult She understands benefits of wt loss as well as adverse effects of obesity. Consistency with healthy diet and physical activity recommended. Daily brisk walking for 10 min as tolerated.   Return in about 8 months (around 02/05/2021).  Laquon Emel G. [place], MD  [facility]. [place] office.  A few things to remember from today's visit:   Class 2 severe obesity due to excess calories with serious comorbidity and body mass index (BMI) of 36.0 to 36.9 in adult [facility])  Hyperlipidemia, unspecified hyperlipidemia type - Plan: COMPLETE METABOLIC PANEL WITH GFR, Lipid panel  If you need refills please call your pharmacy. Do not use My Chart to request refills or for acute issues that need immediate attention.   Wt Readings from Last 3 Encounters:  05/30/20 242 lb 9.6 oz (110 kg)  04/17/20 248 lb (112.5 kg)  03/11/20 253 lb (114.8 kg)   Continue with ozempic until 07/2020. Portion controlled, chewing slowly,and incorporating exercise will help to maintain wt loss.  Please be sure medication list is accurate. If a new problem present, please set up appointment sooner than planned today.

## 2020-05-30 NOTE — Patient Instructions (Signed)
A few things to remember from today's visit:   Class 2 severe obesity due to excess calories with serious comorbidity and body mass index (BMI) of 36.0 to 36.9 in adult [facility])  Hyperlipidemia, unspecified hyperlipidemia type - Plan: COMPLETE METABOLIC PANEL WITH GFR, Lipid panel  If you need refills please call your pharmacy. Do not use My Chart to request refills or for acute issues that need immediate attention.   Wt Readings from Last 3 Encounters:  05/30/20 242 lb 9.6 oz (110 kg)  04/17/20 248 lb (112.5 kg)  03/11/20 253 lb (114.8 kg)   Continue with ozempic until 07/2020. Portion controlled, chewing slowly,and incorporating exercise will help to maintain wt loss.  Please be sure medication list is accurate. If a new problem present, please set up appointment sooner than planned today.

## 2020-05-30 NOTE — Assessment & Plan Note (Signed)
Continue Lovastatin 20 mg daily. Will adjust treatment if needed and according to FLP result.

## 2020-05-31 LAB — COMPLETE METABOLIC PANEL WITH GFR
AG Ratio: 1.4 (calc) (ref 1.0–2.5)
ALT: 14 U/L (ref 6–29)
AST: 12 U/L (ref 10–35)
Albumin: 3.9 g/dL (ref 3.6–5.1)
Alkaline phosphatase (APISO): 89 U/L (ref 31–125)
BUN/Creatinine Ratio: 7 (calc) (ref 6–22)
BUN: 6 mg/dL — ABNORMAL LOW (ref 7–25)
CO2: 28 mmol/L (ref 20–32)
Calcium: 9.5 mg/dL (ref 8.6–10.2)
Chloride: 103 mmol/L (ref 98–110)
Creat: 0.86 mg/dL (ref 0.50–1.10)
GFR, Est African American: 95 mL/min/{1.73_m2} (ref >=60)
GFR, Est Non African American: 82 mL/min/{1.73_m2} (ref >=60)
Globulin: 2.7 g/dL (calc) (ref 1.9–3.7)
Glucose, Bld: 81 mg/dL (ref 65–99)
Potassium: 4.4 mmol/L (ref 3.5–5.3)
Sodium: 139 mmol/L (ref 135–146)
Total Bilirubin: 0.8 mg/dL (ref 0.2–1.2)
Total Protein: 6.6 g/dL (ref 6.1–8.1)

## 2020-05-31 LAB — LIPID PANEL
Cholesterol: 180 mg/dL (ref <200)
HDL: 47 mg/dL — ABNORMAL LOW (ref >=50)
LDL Cholesterol (Calc): 108 mg/dL (calc) — ABNORMAL HIGH
Non-HDL Cholesterol (Calc): 133 mg/dL (calc) — ABNORMAL HIGH (ref <130)
Total CHOL/HDL Ratio: 3.8 (calc) (ref <5.0)
Triglycerides: 130 mg/dL (ref <150)

## 2020-06-06 ENCOUNTER — Encounter: Payer: Self-pay | Admitting: Family Medicine

## 2020-08-27 ENCOUNTER — Other Ambulatory Visit: Payer: Self-pay | Admitting: Adult Health

## 2020-08-27 DIAGNOSIS — F411 Generalized anxiety disorder: Secondary | ICD-10-CM

## 2020-08-27 DIAGNOSIS — F339 Major depressive disorder, recurrent, unspecified: Secondary | ICD-10-CM

## 2020-09-17 ENCOUNTER — Ambulatory Visit: Payer: Self-pay | Admitting: Obstetrics and Gynecology

## 2020-09-24 ENCOUNTER — Encounter: Payer: Self-pay | Admitting: Obstetrics and Gynecology

## 2020-09-24 ENCOUNTER — Ambulatory Visit: Payer: Federal, State, Local not specified - PPO | Admitting: Obstetrics and Gynecology

## 2020-09-24 ENCOUNTER — Other Ambulatory Visit (HOSPITAL_COMMUNITY)
Admission: RE | Admit: 2020-09-24 | Discharge: 2020-09-24 | Disposition: A | Discharge status: Home / Self Care | Payer: Federal, State, Local not specified - PPO | Source: Ambulatory Visit | Attending: Obstetrics and Gynecology | Admitting: Obstetrics and Gynecology

## 2020-09-24 ENCOUNTER — Other Ambulatory Visit: Payer: Self-pay

## 2020-09-24 VITALS — BP 122/78

## 2020-09-24 DIAGNOSIS — N871 Moderate cervical dysplasia: Secondary | ICD-10-CM | POA: Insufficient documentation

## 2020-09-24 DIAGNOSIS — L900 Lichen sclerosus et atrophicus: Secondary | ICD-10-CM

## 2020-09-24 MED ORDER — CLOBETASOL PROPIONATE 0.05 % EX OINT
TOPICAL_OINTMENT | CUTANEOUS | 0 refills | Status: DC
Start: 2020-09-24 — End: 2020-09-25

## 2020-09-24 NOTE — Progress Notes (Signed)
GYNECOLOGY  VISIT   HPI: 45 y.o.   Single  Caucasian  female   G1P0001 with No LMP recorded. (Menstrual status: IUD).   here for repeat pap smear.  She is status post LEEP with final pathology showing CIN II and negative margins for HGSIL but positive margins with LGSIL.  Her endocervical button was normal, and her ECC was also normal and free of dysplasia.  She has known short IUD strings.   Wants a refill of clobetasol for lichen sclerosus.  GYNECOLOGIC HISTORY: No LMP recorded. (Menstrual status: IUD). Contraception:  IUD Menopausal hormone therapy:  N/A Last mammogram: 3 years ago--normal--pt to call to schedule Last pap smear:   01/23/2020        OB History    Gravida  1   Para  1   Term  0   Preterm  0   AB  0   Living  1     SAB  0   IAB  0   Ectopic  0   Multiple  0   Live Births  0              Patient Active Problem List   Diagnosis Date Noted  . Hyperlipidemia 01/15/2020  . Class 2 obesity with body mass index (BMI) of 36.0 to 36.9 in adult 01/15/2020  . Alcohol abuse 03/12/2019  . MDD (major depressive disorder) 02/21/2019  . GAD (generalized anxiety disorder) 02/21/2019  . Insomnia 02/21/2019    Past Medical History:  Diagnosis Date  . Anxiety   . Chicken pox   . Depression   . Elevated cholesterol 2020  . HPV in female   . Lichen sclerosus 5102  . Severe dysplasia of cervix (CIN III) 2021  . Substance abuse [facility])     Past Surgical History:  Procedure Laterality Date  . CLAVICLE SURGERY    . GYNECOLOGIC CRYOSURGERY      Current Outpatient Medications  Medication Sig Dispense Refill  . ARIPiprazole (ABILIFY) 15 MG tablet Take one tablet daily. 90 tablet 3  . buPROPion (WELLBUTRIN XL) 300 MG 24 hr tablet TAKE 1 TABLET (300 MG) EVERY MORNING **STOP 150 MG** 90 tablet 1  . clobetasol ointment (TEMOVATE) 0.05 % Clobetasol ointment 0.05%.  Apply to area in a thin layer bid for 2 weeks as needed.  May apply twice a week for  maintenance dose. Disp:  30 grams  RF:  none. 30 g 0  . lamoTRIgine (LAMICTAL) 150 MG tablet Take 1 tablet (150 mg total) by mouth daily. 90 tablet 3  . levonorgestrel (MIRENA) 20 MCG/24HR IUD by Intrauterine route.    . lovastatin (MEVACOR) 20 MG tablet TAKE 1 TABLET BY MOUTH EVERYDAY AT BEDTIME 90 tablet 1   No current facility-administered medications for this visit.     ALLERGIES: Patient has no known allergies.  Family History  Problem Relation Age of Onset  . Hypertension Mother   . Colon polyps Mother   . Lupus Maternal Grandmother   . Colon cancer Maternal Grandmother   . Colon cancer Paternal Grandfather   . Stomach cancer Neg Hx   . Esophageal cancer Neg Hx     Social History   Socioeconomic History  . Marital status: Single    Spouse name: Not on file  . Number of children: Not on file  . Years of education: Not on file  . Highest education level: Not on file  Occupational History  . Not on file  Tobacco Use  .  Smoking status: Former Smoker    Types: Cigarettes    Quit date: 06/14/2008    Years since quitting: 12.2  . Smokeless tobacco: Never Used  Vaping Use  . Vaping Use: Never used  Substance and Sexual Activity  . Alcohol use: Not Currently  . Drug use: Not Currently  . Sexual activity: Yes    Birth control/protection: I.U.D.    Comment: Mirena insertion summer of 2017  Other Topics Concern  . Not on file  Social History Narrative  . Not on file   Social Determinants of Health   Financial Resource Strain: Not on file  Food Insecurity: Not on file  Transportation Needs: Not on file  Physical Activity: Not on file  Stress: Not on file  Social Connections: Not on file  Intimate Partner Violence: Not on file    Review of Systems  See HPI.   PHYSICAL EXAMINATION:    BP 122/78 (BP Location: Right Arm, Patient Position: Sitting, Cuff Size: Large)     General appearance: alert, cooperative and appears stated age   Pelvic: External genitalia:  hypopigmentation of superior labia minora, right labia minora,and perineum/perianal region.              Urethra:  normal appearing urethra with no masses, tenderness or lesions              Bartholins and Skenes: normal                 Vagina: normal appearing vagina with normal color and discharge, no lesions              Cervix: no lesions.  IUD strings not seen.                 Bimanual Exam:  Uterus:  normal size, contour, position, consistency, mobility, non-tender              Adnexa: no mass, fullness, tenderness            Chaperone was present for exam.  ASSESSMENT  Mirena IUD.  Hx CIN II, status post LEEP.   Lichen sclerosus.   PLAN  LEEP results reviewed with patient.  Pap and HR HPV testing done today.  Next pap to be determined based on results from today. Refill of clobetasol ointment.  Instructions given for use.

## 2020-09-25 MED ORDER — CLOBETASOL PROPIONATE 0.05 % EX OINT: TOPICAL_OINTMENT | CUTANEOUS | 0 refills | Status: DC

## 2020-09-29 LAB — CYTOLOGY - PAP
Comment: NEGATIVE
Diagnosis: NEGATIVE
High risk HPV: NEGATIVE

## 2020-10-01 ENCOUNTER — Encounter: Payer: Self-pay | Admitting: Obstetrics and Gynecology

## 2020-10-19 ENCOUNTER — Encounter: Payer: Self-pay | Admitting: Family Medicine

## 2020-11-29 ENCOUNTER — Other Ambulatory Visit: Payer: Self-pay | Admitting: Adult Health

## 2020-11-29 DIAGNOSIS — F411 Generalized anxiety disorder: Secondary | ICD-10-CM

## 2020-11-29 DIAGNOSIS — F331 Major depressive disorder, recurrent, moderate: Secondary | ICD-10-CM

## 2020-12-02 NOTE — Telephone Encounter (Signed)
Please schedule appt

## 2020-12-02 NOTE — Telephone Encounter (Signed)
Left a message for pt to schedule an appt

## 2020-12-05 ENCOUNTER — Other Ambulatory Visit: Payer: Self-pay

## 2020-12-05 ENCOUNTER — Encounter: Payer: Self-pay | Admitting: Adult Health

## 2020-12-05 ENCOUNTER — Ambulatory Visit: Payer: Federal, State, Local not specified - PPO | Admitting: Adult Health

## 2020-12-05 DIAGNOSIS — F331 Major depressive disorder, recurrent, moderate: Secondary | ICD-10-CM

## 2020-12-05 DIAGNOSIS — G4700 Insomnia, unspecified: Secondary | ICD-10-CM | POA: Diagnosis not present

## 2020-12-05 DIAGNOSIS — F411 Generalized anxiety disorder: Secondary | ICD-10-CM

## 2020-12-05 MED ORDER — ESCITALOPRAM OXALATE 10 MG PO TABS
10.0000 mg | ORAL_TABLET | Freq: Every day | ORAL | 3 refills | Status: DC
Start: 2020-12-05 — End: 2021-12-14

## 2020-12-05 MED ORDER — LAMOTRIGINE 150 MG PO TABS: 150.0000 mg | ORAL_TABLET | Freq: Every day | ORAL | 3 refills | Status: DC

## 2020-12-05 MED ORDER — ARIPIPRAZOLE 15 MG PO TABS
ORAL_TABLET | ORAL | 3 refills | Status: DC
Start: 2020-12-05 — End: 2021-12-14

## 2020-12-05 MED ORDER — BUPROPION HCL ER (XL) 300 MG PO TB24: ORAL_TABLET | ORAL | 3 refills | Status: DC

## 2020-12-05 NOTE — Progress Notes (Signed)
Kristina Chambers [id number] 01/05/1976 45 y.o.  Subjective:   Patient ID:  Kristina Chambers is a 45 y.o. (DOB 1976/01/04) female.  Chief Complaint: No chief complaint on file.   HPI LAMYRA MALCOLM presents to the office today for follow-up of MDD, GAD, and insomnia.  Describes mood today as "so-so". Pleasant. Mood symptoms - reports depression - "a little not extreme", anxiety - increased", and irritability - "not really". Increased worry and ruminations. Entertaining worst case scenarios. Spending more time in bed. Stating "there is not a lot to get excited about". Lower interest and motivation. Taking medications as prescribed.  Energy levels lower. Active, does not have a regular exercise routine. Walking some days. Enjoys some usual interests and activities. Single. Lives with 10 year old son.  Appetite adequate. Weight loss - 10 pounds - 260 from 270 pounds. Sleeps wells most nights. Averages 7 to 8 hours - sleeping more lately.  Focus and concentration "so-so". Completing tasks. Managing some aspects of household. 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 Denies SI or HI.  Denies AH or VH.  Previous medications: Wellbutrin, Cymbalta, Lexapro   Review of Systems:  Review of Systems  Musculoskeletal:  Negative for gait problem.  Neurological:  Negative for tremors.  Psychiatric/Behavioral:         Please refer to HPI   Medications: I have reviewed the patient's current medications.  Current Outpatient Medications  Medication Sig Dispense Refill   escitalopram (LEXAPRO) 10 MG tablet Take 1 tablet (10 mg total) by mouth daily. 90 tablet 3   ARIPiprazole (ABILIFY) 15 MG tablet Take one tablet daily. 90 tablet 3   buPROPion (WELLBUTRIN XL) 300 MG 24 hr tablet TAKE 1 TABLET (300 MG) EVERY MORNING. 90 tablet 3   clobetasol ointment (TEMOVATE) 0.05 % Apply to area in a thin layer bid for 2 weeks as needed.  May apply once daily at night time, twice a week for maintenance dose.  30 g 0   lamoTRIgine (LAMICTAL) 150 MG tablet Take 1 tablet (150 mg total) by mouth daily. 90 tablet 3   levonorgestrel (MIRENA) 20 MCG/24HR IUD by Intrauterine route.     lovastatin (MEVACOR) 20 MG tablet TAKE 1 TABLET BY MOUTH EVERYDAY AT BEDTIME 90 tablet 1   No current facility-administered medications for this visit.    Medication Side Effects: None  Allergies: No Known Allergies  Past Medical History:  Diagnosis Date   Anxiety    Chicken pox    Depression    Elevated cholesterol 2020   HPV in female    Lichen sclerosus 8315   Severe dysplasia of cervix (CIN III) 2021   Substance abuse ([place])     Past Medical History, Surgical history, Social history, and Family history were reviewed and updated as appropriate.   Please see review of systems for further details on the patient's review from today.   Objective:   Physical Exam:  There were no vitals taken for this visit.  Physical Exam Constitutional:      General: She is not in acute distress. Musculoskeletal:        General: No deformity.  Neurological:     Mental Status: She is alert and oriented to person, place, and time.     Coordination: Coordination normal.  Psychiatric:        Attention and Perception: Attention and perception normal. She does not perceive auditory or visual hallucinations.        Mood and  Affect: Mood normal. Mood is not anxious or depressed. Affect is not labile, blunt, angry or inappropriate.        Speech: Speech normal.        Behavior: Behavior normal.        Thought Content: Thought content normal. Thought content is not paranoid or delusional. Thought content does not include homicidal or suicidal ideation. Thought content does not include homicidal or suicidal plan.        Cognition and Memory: Cognition and memory normal.        Judgment: Judgment normal.     Comments: Insight intact    Lab Review:     Component Value Date/Time   NA 139 05/30/2020 1021   NA 138 12/26/2018  1144   K 4.4 05/30/2020 1021   CL 103 05/30/2020 1021   CO2 28 05/30/2020 1021   GLUCOSE 81 05/30/2020 1021   BUN 6 (L) 05/30/2020 1021   BUN 10 12/26/2018 1144   CREATININE 0.86 05/30/2020 1021   CALCIUM 9.5 05/30/2020 1021   PROT 6.6 05/30/2020 1021   PROT 6.9 12/26/2018 1144   ALBUMIN 4.4 12/26/2018 1144   AST 12 05/30/2020 1021   ALT 14 05/30/2020 1021   ALKPHOS 104 12/26/2018 1144   BILITOT 0.8 05/30/2020 1021   BILITOT 1.0 12/26/2018 1144   GFRNONAA 82 05/30/2020 1021   GFRAA 95 05/30/2020 1021       Component Value Date/Time   WBC 8.2 12/26/2018 1144   RBC 4.44 12/26/2018 1144   HGB 13.0 12/26/2018 1144   HCT 38.6 12/26/2018 1144   PLT 337 12/26/2018 1144   MCV 87 12/26/2018 1144   MCH 29.3 12/26/2018 1144   MCHC 33.7 12/26/2018 1144   RDW 11.7 12/26/2018 1144    No results found for: POCLITH, LITHIUM   No results found for: PHENYTOIN, PHENOBARB, VALPROATE, CBMZ   .res Assessment: Plan:    Plan:     Lamictal 150mg  daily - mood instability Abilify 15mg  daily to target depression Wellbutrin XL 300mg  daily Add Lexapro mg daily - anxiety  Uses Melatonin some nights  RTC 3 months  Patient advised to contact office with any questions, adverse effects, or acute worsening in signs and symptoms.  Discussed potential metabolic side effects associated with atypical antipsychotics, as well as potential risk for movement side effects. Advised pt to contact office if movement side effects occur.   Counseled patient regarding potential benefits, risks, and side effects of Lamictal to include potential risk of Stevens-Johnson syndrome. Advised patient to stop taking Lamictal and contact office immediately if rash develops and to seek urgent medical attention if rash is severe and/or spreading quickly.  Discussed potential benefits, risk, and side effects of benzodiazepines to include potential risk of tolerance and dependence, as well as possible drowsiness.  Advised  patient not to drive if experiencing drowsiness and to take lowest possible effective dose to minimize risk of dependence and tolerance. Diagnoses and all orders for this visit:  Insomnia, unspecified type  Major depressive disorder, recurrent episode, moderate (HCC) -     lamoTRIgine (LAMICTAL) 150 MG tablet; Take 1 tablet (150 mg total) by mouth daily. -     ARIPiprazole (ABILIFY) 15 MG tablet; Take one tablet daily.  Generalized anxiety disorder -     lamoTRIgine (LAMICTAL) 150 MG tablet; Take 1 tablet (150 mg total) by mouth daily. -     ARIPiprazole (ABILIFY) 15 MG tablet; Take one tablet daily.  Other orders -  buPROPion (WELLBUTRIN XL) 300 MG 24 hr tablet; TAKE 1 TABLET (300 MG) EVERY MORNING. -     escitalopram (LEXAPRO) 10 MG tablet; Take 1 tablet (10 mg total) by mouth daily.    Please see After Visit Summary for patient specific instructions.  Future Appointments  Date Time Provider [place]  02/18/2021  8:30 AM Yisroel Ramming, Everardo All, MD [facility] None    No orders of the defined types were placed in this encounter.   -------------------------------

## 2021-01-29 ENCOUNTER — Other Ambulatory Visit: Payer: Self-pay | Admitting: Family Medicine

## 2021-02-18 ENCOUNTER — Ambulatory Visit: Payer: Federal, State, Local not specified - PPO | Admitting: Obstetrics and Gynecology

## 2021-03-23 ENCOUNTER — Other Ambulatory Visit: Payer: Self-pay | Admitting: Obstetrics and Gynecology

## 2021-03-23 DIAGNOSIS — Z1231 Encounter for screening mammogram for malignant neoplasm of breast: Secondary | ICD-10-CM

## 2021-03-28 ENCOUNTER — Ambulatory Visit
Admission: RE | Admit: 2021-03-28 | Discharge: 2021-03-28 | Disposition: A | Discharge status: Home / Self Care | Payer: Federal, State, Local not specified - PPO | Source: Ambulatory Visit | Attending: Obstetrics and Gynecology | Admitting: Obstetrics and Gynecology

## 2021-03-28 DIAGNOSIS — Z1231 Encounter for screening mammogram for malignant neoplasm of breast: Secondary | ICD-10-CM

## 2021-05-03 ENCOUNTER — Other Ambulatory Visit: Payer: Self-pay | Admitting: Family Medicine

## 2021-05-14 NOTE — Progress Notes (Signed)
HPI: Kristina Chambers is a 45 y.o. female, who is here today for her routine physical.  Last CPE: 01/23/20 gyn routine exam.  Regular exercise 3 or more time per week: Walking and boot camp. Following a healthy diet: She is trying to do better for over 5 weeks, she uses [employer].  Frustrated because she is still gaining weight. In the past she try Ozempic, which is helped some. Sleeping about 8 hours.  Chronic medical problems: MDD,insomnia,HLD,GAD.  Immunization History  Administered Date(s) Administered   HPV 9-valent 12/26/2018, 02/26/2019, 01/23/2020   Tdap 12/26/2018   Health Maintenance  Topic Date Due   Hepatitis C Screening  Never done   INFLUENZA VACCINE  Never done   COVID-19 Vaccine (1) 05/31/2021 (Originally 08/27/1976)   HIV Screening  05/15/2032 (Originally 02/26/1990)   PAP SMEAR-Modifier  09/25/2023   COLONOSCOPY (Pts 45-40yrs Insurance coverage will need to be confirmed)  09/25/2024   TETANUS/TDAP  12/25/2028   Pneumococcal Vaccine 73-22 Years old  Aged Out   HPV VACCINES  Aged Out   Follows with Gyn, Dr Judeth Horn, last visit 09/24/20.  Hyperlipidemia: Currently she is on lovastatin 20 mg daily. Lab Results  Component Value Date   CHOL 180 05/30/2020   HDL 47 (L) 05/30/2020   LDLCALC 108 (H) 05/30/2020   TRIG 130 05/30/2020   CHOLHDL 3.8 05/30/2020   Review of Systems  Constitutional:  Negative for appetite change and fever.  HENT:  Negative for hearing loss, mouth sores, sore throat and trouble swallowing.   Eyes:  Negative for photophobia, redness and visual disturbance.  Respiratory:  Positive for cough (Improving. Sick last week with URI.). Negative for shortness of breath and wheezing.   Cardiovascular:  Negative for chest pain and leg swelling.  Gastrointestinal:  Negative for abdominal pain, nausea and vomiting.       No changes in bowel habits.  Endocrine: Negative for cold intolerance, heat intolerance, polydipsia, polyphagia and  polyuria.  Genitourinary:  Negative for decreased urine volume, dysuria, hematuria, vaginal bleeding and vaginal discharge.  Musculoskeletal:  Negative for gait problem and myalgias.  Skin:  Negative for color change and rash.  Allergic/Immunologic: Negative for environmental allergies.  Neurological:  Negative for syncope, weakness and headaches.  Hematological:  Negative for adenopathy. Does not bruise/bleed easily.  Psychiatric/Behavioral:  Negative for confusion and hallucinations.   All other systems reviewed and are negative.  Current Outpatient Medications on File Prior to Visit  Medication Sig Dispense Refill   ARIPiprazole (ABILIFY) 15 MG tablet Take one tablet daily. 90 tablet 3   buPROPion (WELLBUTRIN XL) 300 MG 24 hr tablet TAKE 1 TABLET (300 MG) EVERY MORNING. 90 tablet 3   clobetasol ointment (TEMOVATE) 0.05 % Apply to area in a thin layer bid for 2 weeks as needed.  May apply once daily at night time, twice a week for maintenance dose. 30 g 0   escitalopram (LEXAPRO) 10 MG tablet Take 1 tablet (10 mg total) by mouth daily. 90 tablet 3   lamoTRIgine (LAMICTAL) 150 MG tablet Take 1 tablet (150 mg total) by mouth daily. 90 tablet 3   levonorgestrel (MIRENA) 20 MCG/24HR IUD by Intrauterine route.     lovastatin (MEVACOR) 20 MG tablet TAKE 1 TABLET BY MOUTH EVERYDAY AT BEDTIME 90 tablet 0   No current facility-administered medications on file prior to visit.   Past Medical History:  Diagnosis Date   Anxiety    Chicken pox    Depression  Elevated cholesterol 2020   HPV in female    Lichen sclerosus 1610   Severe dysplasia of cervix (CIN III) 2021   Substance abuse ([place])    Past Surgical History:  Procedure Laterality Date   CLAVICLE SURGERY     GYNECOLOGIC CRYOSURGERY     LEEP  03/11/2020   CIN II, margins positive for CIN I   No Known Allergies  Family History  Problem Relation Age of Onset   Hypertension Mother    Colon polyps Mother    Lupus Maternal  Grandmother    Colon cancer Maternal Grandmother    Colon cancer Paternal Grandfather    Stomach cancer Neg Hx    Esophageal cancer Neg Hx    Breast cancer Neg Hx    Social History   Socioeconomic History   Marital status: Single    Spouse name: Not on file   Number of children: Not on file   Years of education: Not on file   Highest education level: Not on file  Occupational History   Not on file  Tobacco Use   Smoking status: Former    Types: Cigarettes    Quit date: 06/14/2008    Years since quitting: 12.9   Smokeless tobacco: Never  Vaping Use   Vaping Use: Never used  Substance and Sexual Activity   Alcohol use: Not Currently   Drug use: Not Currently   Sexual activity: Yes    Birth control/protection: I.U.D.    Comment: Mirena insertion summer of 2017  Other Topics Concern   Not on file  Social History Narrative   Not on file   Social Determinants of Health   Financial Resource Strain: Not on file  Food Insecurity: Not on file  Transportation Needs: Not on file  Physical Activity: Not on file  Stress: Not on file  Social Connections: Not on file   Vitals:   05/15/21 0823  BP: 122/84  Pulse: 85  Resp: 16  Temp: 98.3 F (36.8 C)  SpO2: 99%   Body mass index is 36.31 kg/m.  Wt Readings from Last 3 Encounters:  05/15/21 275 lb 3.2 oz (124.8 kg)  05/30/20 242 lb 9.6 oz (110 kg)  04/17/20 248 lb (112.5 kg)   Physical Exam Vitals and nursing note reviewed.  Constitutional:      General: She is not in acute distress.    Appearance: She is well-developed.  HENT:     Head: Normocephalic and atraumatic.     Right Ear: Hearing, tympanic membrane, ear canal and external ear normal.     Left Ear: Hearing, tympanic membrane, ear canal and external ear normal.     Mouth/Throat:     Mouth: Mucous membranes are moist.     Pharynx: Oropharynx is clear. Uvula midline.  Eyes:     Extraocular Movements: Extraocular movements intact.     Conjunctiva/sclera:  Conjunctivae normal.     Pupils: Pupils are equal, round, and reactive to light.  Neck:     Thyroid: No thyromegaly.     Trachea: No tracheal deviation.  Cardiovascular:     Rate and Rhythm: Normal rate and regular rhythm.     Pulses:          Dorsalis pedis pulses are 2+ on the right side and 2+ on the left side.     Heart sounds: No murmur heard. Pulmonary:     Effort: Pulmonary effort is normal. No respiratory distress.     Breath sounds: Normal breath  sounds.  Abdominal:     Palpations: Abdomen is soft. There is no hepatomegaly or mass.     Tenderness: There is no abdominal tenderness.  Genitourinary:    Comments: Deferred to gyn. Musculoskeletal:     Comments: No major deformity or signs of synovitis appreciated.  Lymphadenopathy:     Cervical: No cervical adenopathy.     Upper Body:     Right upper body: No supraclavicular adenopathy.     Left upper body: No supraclavicular adenopathy.  Skin:    General: Skin is warm.     Findings: No erythema or rash.  Neurological:     General: No focal deficit present.     Mental Status: She is alert and oriented to person, place, and time.     Cranial Nerves: No cranial nerve deficit.     Coordination: Coordination normal.     Gait: Gait normal.     Deep Tendon Reflexes:     Reflex Scores:      Bicep reflexes are 2+ on the right side and 2+ on the left side.      Patellar reflexes are 2+ on the right side and 2+ on the left side. Psychiatric:        Speech: Speech normal.     Comments: Well groomed, good eye contact.   ASSESSMENT AND PLAN:  Kristina Chambers was here today annual physical examination.  Orders Placed This Encounter  Procedures   Comprehensive metabolic panel   Hemoglobin A1c   Lipid panel   Hepatitis C antibody   LDL cholesterol, direct   Amb Ref to Medical Weight Management   Lab Results  Component Value Date   HGBA1C 6.1 05/15/2021   Lab Results  Component Value Date   CREATININE 0.80  05/15/2021   BUN 9 05/15/2021   NA 137 05/15/2021   K 4.1 05/15/2021   CL 104 05/15/2021   CO2 27 05/15/2021   Lab Results  Component Value Date   CHOL 191 05/15/2021   HDL 42.40 05/15/2021   LDLCALC 108 (H) 05/30/2020   LDLDIRECT 134.0 05/15/2021   TRIG 221.0 (H) 05/15/2021   CHOLHDL 4 05/15/2021   Lab Results  Component Value Date   ALT 35 05/15/2021   AST 18 05/15/2021   ALKPHOS 78 05/15/2021   BILITOT 0.5 05/15/2021   Routine general medical examination at a health care facility We discussed the importance of regular physical activity and healthy diet for prevention of chronic illness and/or complications. Preventive guidelines reviewed. Vaccination up-to-date. Continue female preventive care with her gynecologist. Next CPE in a year.  The 10-year ASCVD risk score (Arnett DK, et al., 2019) is: 1.1%   Values used to calculate the score:     Age: 54 years     Sex: Female     Is Non-Hispanic African American: No     Diabetic: No     Tobacco smoker: No     Systolic Blood Pressure: 062 mmHg     Is BP treated: No     HDL Cholesterol: 42.4 mg/dL     Total Cholesterol: 191 mg/dL  Diabetes mellitus screening -     Comprehensive metabolic panel; Future -     Hemoglobin A1c; Future  Encounter for HCV screening test for low risk patient -     Hepatitis C antibody; Future  Hyperlipidemia Continue lovastatin 20 mg daily and low-fat diet. Further recommendation will be given according to lipid panel result.  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 I do not recommend starting pharmacologic treatment again, instead encouraged to be consistent with calorie counting, 1700 Kca/day, and regular physical activity. Referral to [facility] loss clinic placed.  Return in 1 year (on 05/15/2022) for cpe.  Tashiya Souders G. [place], MD  [facility]. [place] office.

## 2021-05-15 ENCOUNTER — Encounter: Payer: Self-pay | Admitting: Family Medicine

## 2021-05-15 ENCOUNTER — Ambulatory Visit (INDEPENDENT_AMBULATORY_CARE_PROVIDER_SITE_OTHER): Payer: Federal, State, Local not specified - PPO | Admitting: Family Medicine

## 2021-05-15 VITALS — BP 122/84 | HR 85 | Temp 98.3°F | Resp 16 | Ht 73.0 in | Wt 275.2 lb

## 2021-05-15 DIAGNOSIS — Z0000 Encounter for general adult medical examination without abnormal findings: Secondary | ICD-10-CM

## 2021-05-15 DIAGNOSIS — Z6836 Body mass index (BMI) 36.0-36.9, adult: Secondary | ICD-10-CM

## 2021-05-15 DIAGNOSIS — Z1159 Encounter for screening for other viral diseases: Secondary | ICD-10-CM | POA: Diagnosis not present

## 2021-05-15 DIAGNOSIS — E785 Hyperlipidemia, unspecified: Secondary | ICD-10-CM

## 2021-05-15 DIAGNOSIS — Z131 Encounter for screening for diabetes mellitus: Secondary | ICD-10-CM | POA: Diagnosis not present

## 2021-05-15 LAB — COMPREHENSIVE METABOLIC PANEL
ALT: 35 U/L (ref 0–35)
AST: 18 U/L (ref 0–37)
Albumin: 4.1 g/dL (ref 3.5–5.2)
Alkaline Phosphatase: 78 U/L (ref 39–117)
BUN: 9 mg/dL (ref 6–23)
CO2: 27 mEq/L (ref 19–32)
Calcium: 9.3 mg/dL (ref 8.4–10.5)
Chloride: 104 mEq/L (ref 96–112)
Creatinine, Ser: 0.8 mg/dL (ref 0.40–1.20)
GFR: 88.49 mL/min (ref >60.00)
Glucose, Bld: 101 mg/dL — ABNORMAL HIGH (ref 70–99)
Potassium: 4.1 mEq/L (ref 3.5–5.1)
Sodium: 137 mEq/L (ref 135–145)
Total Bilirubin: 0.5 mg/dL (ref 0.2–1.2)
Total Protein: 7.3 g/dL (ref 6.0–8.3)

## 2021-05-15 LAB — LIPID PANEL
Cholesterol: 191 mg/dL (ref 0–200)
HDL: 42.4 mg/dL (ref >39.00)
NonHDL: 148.22
Total CHOL/HDL Ratio: 4
Triglycerides: 221 mg/dL — ABNORMAL HIGH (ref 0.0–149.0)
VLDL: 44.2 mg/dL — ABNORMAL HIGH (ref 0.0–40.0)

## 2021-05-15 LAB — HEMOGLOBIN A1C: Hgb A1c MFr Bld: 6.1 % (ref 4.6–6.5)

## 2021-05-15 LAB — LDL CHOLESTEROL, DIRECT: Direct LDL: 134 mg/dL

## 2021-05-15 MED ORDER — LOVASTATIN 20 MG PO TABS: 20.0000 mg | ORAL_TABLET | Freq: Every day | ORAL | 3 refills | Status: DC

## 2021-05-15 NOTE — Patient Instructions (Addendum)
A few things to remember from today's visit:  Routine general medical examination at a health care facility  Hyperlipidemia, unspecified hyperlipidemia type - Plan: Lipid panel  Diabetes mellitus screening - Plan: Comprehensive metabolic panel, Hemoglobin A1c  Encounter for HCV screening test for low risk patient - Plan: Hepatitis C antibody  Screening for endocrine, metabolic and immunity disorder  Class 2 severe obesity due to excess calories with serious comorbidity and body mass index (BMI) of 36.0 to 36.9 in adult [facility]) - Plan: Amb Ref to Medical Weight Management  Do not use My Chart to request refills or for acute issues that need immediate attention.   Please be sure medication list is accurate. If a new problem present, please set up appointment sooner than planned today.  Health Maintenance, Female Adopting a healthy lifestyle and getting preventive care are important in promoting health and wellness. Ask your health care provider about: The right schedule for you to have regular tests and exams. Things you can do on your own to prevent diseases and keep yourself healthy. What should I know about diet, weight, and exercise? Eat a healthy diet  Eat a diet that includes plenty of vegetables, fruits, low-fat dairy products, and lean protein. Do not eat a lot of foods that are high in solid fats, added sugars, or sodium. Maintain a healthy weight Body mass index (BMI) is used to identify weight problems. It estimates body fat based on height and weight. Your health care provider can help determine your BMI and help you achieve or maintain a healthy weight. Get regular exercise Get regular exercise. This is one of the most important things you can do for your health. Most adults should: Exercise for at least 150 minutes each week. The exercise should increase your heart rate and make you sweat (moderate-intensity exercise). Do strengthening exercises at least twice a week. This  is in addition to the moderate-intensity exercise. Spend less time sitting. Even light physical activity can be beneficial. Watch cholesterol and blood lipids Have your blood tested for lipids and cholesterol at 46 years of age, then have this test every 5 years. Have your cholesterol levels checked more often if: Your lipid or cholesterol levels are high. You are older than 46 years of age. You are at high risk for heart disease. What should I know about cancer screening? Depending on your health history and family history, you may need to have cancer screening at various ages. This may include screening for: Breast cancer. Cervical cancer. Colorectal cancer. Skin cancer. Lung cancer. What should I know about heart disease, diabetes, and high blood pressure? Blood pressure and heart disease High blood pressure causes heart disease and increases the risk of stroke. This is more likely to develop in people who have high blood pressure readings or are overweight. Have your blood pressure checked: Every 3-5 years if you are 31-48 years of age. Every year if you are 59 years old or older. Diabetes Have regular diabetes screenings. This checks your fasting blood sugar level. Have the screening done: Once every three years after age 24 if you are at a normal weight and have a low risk for diabetes. More often and at a younger age if you are overweight or have a high risk for diabetes. What should I know about preventing infection? Hepatitis B If you have a higher risk for hepatitis B, you should be screened for this virus. Talk with your health care provider to find out if you are  at risk for hepatitis B infection. Hepatitis C Testing is recommended for: Everyone born from [birth year] through 1966. Anyone with known risk factors for hepatitis C. Sexually transmitted infections (STIs) Get screened for STIs, including gonorrhea and chlamydia, if: You are sexually active and are younger than 46  years of age. You are older than 46 years of age and your health care provider tells you that you are at risk for this type of infection. Your sexual activity has changed since you were last screened, and you are at increased risk for chlamydia or gonorrhea. Ask your health care provider if you are at risk. Ask your health care provider about whether you are at high risk for HIV. Your health care provider may recommend a prescription medicine to help prevent HIV infection. If you choose to take medicine to prevent HIV, you should first get tested for HIV. You should then be tested every 3 months for as long as you are taking the medicine. Pregnancy If you are about to stop having your period (premenopausal) and you may become pregnant, seek counseling before you get pregnant. Take 400 to 800 micrograms (mcg) of folic acid every day if you become pregnant. Ask for birth control (contraception) if you want to prevent pregnancy. Osteoporosis and menopause Osteoporosis is a disease in which the bones lose minerals and strength with aging. This can result in bone fractures. If you are 28 years old or older, or if you are at risk for osteoporosis and fractures, ask your health care provider if you should: Be screened for bone loss. Take a calcium or vitamin D supplement to lower your risk of fractures. Be given hormone replacement therapy (HRT) to treat symptoms of menopause. Follow these instructions at home: Alcohol use Do not drink alcohol if: Your health care provider tells you not to drink. You are pregnant, may be pregnant, or are planning to become pregnant. If you drink alcohol: Limit how much you have to: 0-1 drink a day. Know how much alcohol is in your drink. In the [place] one drink equals one 12 oz bottle of beer (355 mL), one 5 oz glass of wine (148 mL), or one 1 oz glass of hard liquor (44 mL). Lifestyle Do not use any products that contain nicotine or tobacco. These products include  cigarettes, chewing tobacco, and vaping devices, such as e-cigarettes. If you need help quitting, ask your health care provider. Do not use street drugs. Do not share needles. Ask your health care provider for help if you need support or information about quitting drugs. General instructions Schedule regular health, dental, and eye exams. Stay current with your vaccines. Tell your health care provider if: You often feel depressed. You have ever been abused or do not feel safe at home. Summary Adopting a healthy lifestyle and getting preventive care are important in promoting health and wellness. Follow your health care provider's instructions about healthy diet, exercising, and getting tested or screened for diseases. Follow your health care provider's instructions on monitoring your cholesterol and blood pressure. This information is not intended to replace advice given to you by your health care provider. Make sure you discuss any questions you have with your health care provider. Document Revised: 10/20/2020 Document Reviewed: 10/20/2020 Elsevier Patient Education  [place].

## 2021-05-15 NOTE — Assessment & Plan Note (Signed)
Continue lovastatin 20 mg daily and low-fat diet. Further recommendation will be given according to lipid panel result.

## 2021-05-15 NOTE — Assessment & Plan Note (Addendum)
Gained some wt since 05/2020. I do not recommend starting pharmacologic treatment again, instead encouraged to be consistent with calorie counting, 1700 Kca/day, and regular physical activity. Referral to [facility] loss clinic placed.

## 2021-05-19 LAB — HEPATITIS C ANTIBODY
Hepatitis C Ab: NONREACTIVE
SIGNAL TO CUT-OFF: 0.03 (ref <1.00)

## 2021-05-20 ENCOUNTER — Ambulatory Visit: Payer: Federal, State, Local not specified - PPO | Admitting: Obstetrics and Gynecology

## 2021-06-01 ENCOUNTER — Ambulatory Visit
Admission: EM | Admit: 2021-06-01 | Discharge: 2021-06-01 | Disposition: A | Discharge status: Home / Self Care | Payer: Federal, State, Local not specified - PPO | Attending: Emergency Medicine | Admitting: Emergency Medicine

## 2021-06-01 ENCOUNTER — Other Ambulatory Visit: Payer: Self-pay

## 2021-06-01 ENCOUNTER — Ambulatory Visit (HOSPITAL_BASED_OUTPATIENT_CLINIC_OR_DEPARTMENT_OTHER)
Admission: RE | Admit: 2021-06-01 | Discharge: 2021-06-01 | Disposition: A | Discharge status: Home / Self Care | Payer: Federal, State, Local not specified - PPO | Source: Ambulatory Visit | Attending: Emergency Medicine | Admitting: Emergency Medicine

## 2021-06-01 DIAGNOSIS — M25561 Pain in right knee: Secondary | ICD-10-CM | POA: Diagnosis not present

## 2021-06-01 DIAGNOSIS — M25461 Effusion, right knee: Secondary | ICD-10-CM | POA: Diagnosis not present

## 2021-06-01 NOTE — ED Provider Notes (Signed)
[facility]    CSN: [id number] Arrival date & time: 06/01/21  1345    HISTORY   Chief Complaint  Patient presents with   Knee Pain   HPI Kristina Chambers is a 45 y.o. female. Patient states that yesterday she twisted her knee and heard a loud pop, states she injured her knee about 30 years ago in her teens, states she noticed immediately that she began to have swelling on the right side of her knee.  Patient states for the most part it feels okay unless she twists it in the same way again.  Patient is requesting x-ray and possible referral to orthopedics.  The history is provided by the patient.  Past Medical History:  Diagnosis Date   Anxiety    Chicken pox    Depression    Elevated cholesterol 2020   HPV in female    Lichen sclerosus 3790   Severe dysplasia of cervix (CIN III) 2021   Substance abuse ([place])    Patient Active Problem List   Diagnosis Date Noted   Hyperlipidemia 01/15/2020   Class 2 obesity with body mass index (BMI) of 36.0 to 36.9 in adult 01/15/2020   Alcohol abuse 03/12/2019   MDD (major depressive disorder) 02/21/2019   GAD (generalized anxiety disorder) 02/21/2019   Past Surgical History:  Procedure Laterality Date   CLAVICLE SURGERY     GYNECOLOGIC CRYOSURGERY     LEEP  03/11/2020   CIN II, margins positive for CIN I   OB History     Gravida  1   Para  1   Term  0   Preterm  0   AB  0   Living  1      SAB  0   IAB  0   Ectopic  0   Multiple  0   Live Births  0          Home Medications    Prior to Admission medications   Medication Sig Start Date End Date Taking? Authorizing Provider  ARIPiprazole (ABILIFY) 15 MG tablet Take one tablet daily. 12/05/20   Mozingo, Berdie Ogren, NP  buPROPion (WELLBUTRIN XL) 300 MG 24 hr tablet TAKE 1 TABLET (300 MG) EVERY MORNING. 12/05/20   Mozingo, Berdie Ogren, NP  clobetasol ointment (TEMOVATE) 0.05 % Apply to area in a thin layer bid for 2 weeks as needed.  May  apply once daily at night time, twice a week for maintenance dose. 09/25/20   Nunzio Cobbs, MD  escitalopram (LEXAPRO) 10 MG tablet Take 1 tablet (10 mg total) by mouth daily. 12/05/20   Mozingo, Berdie Ogren, NP  lamoTRIgine (LAMICTAL) 150 MG tablet Take 1 tablet (150 mg total) by mouth daily. 12/05/20   Mozingo, Berdie Ogren, NP  levonorgestrel (MIRENA) 20 MCG/24HR IUD by Intrauterine route.    [provider]  lovastatin (MEVACOR) 20 MG tablet Take 1 tablet (20 mg total) by mouth at bedtime. 05/15/21   Martinique, Betty G, MD    Family History Family History  Problem Relation Age of Onset   Hypertension Mother    Colon polyps Mother    Lupus Maternal Grandmother    Colon cancer Maternal Grandmother    Colon cancer Paternal Grandfather    Stomach cancer Neg Hx    Esophageal cancer Neg Hx    Breast cancer Neg Hx    Social History Social History   Tobacco Use   Smoking status: Former  Types: Cigarettes    Quit date: 06/14/2008    Years since quitting: 12.9   Smokeless tobacco: Never  Vaping Use   Vaping Use: Never used  Substance Use Topics   Alcohol use: Not Currently   Drug use: Not Currently   Allergies   Patient has no known allergies.  Review of Systems Review of Systems Pertinent findings noted in history of present illness.   Physical Exam Triage Vital Signs ED Triage Vitals  Enc Vitals Group     BP 04/10/21 0827 (!) 147/82     Pulse Rate 04/10/21 0827 72     Resp 04/10/21 0827 18     Temp 04/10/21 0827 98.3 F (36.8 C)     Temp Source 04/10/21 0827 Oral     SpO2 04/10/21 0827 98 %     Weight --      Height --      Head Circumference --      Peak Flow --      Pain Score 04/10/21 0826 5     Pain Loc --      Pain Edu? --      Excl. in [place]? --   No data found.  Updated Vital Signs BP 119/80 (BP Location: Right Arm)    Pulse (!) 105    Temp 98.8 F (37.1 C) (Oral)    Resp 20    SpO2 94%   Physical Exam Musculoskeletal:      Right knee: Swelling and effusion present. No deformity, erythema, ecchymosis, lacerations or bony tenderness. Normal range of motion. Tenderness present over the LCL and patellar tendon. No medial joint line, lateral joint line, MCL, ACL or PCL tenderness. No LCL laxity.    Visual Acuity Right Eye Distance:   Left Eye Distance:   Bilateral Distance:    Right Eye Near:   Left Eye Near:    Bilateral Near:     UC Couse / Diagnostics / Procedures:    EKG  Radiology No results found.  Procedures Procedures (including critical care time)  UC Diagnoses / Final Clinical Impressions(s)   I have reviewed the triage vital signs and the nursing notes.  Pertinent labs & imaging results that were available during my care of the patient were reviewed by me and considered in my medical decision making (see chart for details).    Final diagnoses:  Acute pain of right knee   X-ray of right knee ordered at outside imaging facility, patient advised we will notify her of the results of the x-ray once received, patient provided with the names of 2 walk-in orthopedic clinics should that be necessary.  Patient provided with a stabilizing knee brace.  Patient politely declined offer for pain medication prior to discharge. ED Prescriptions   None    PDMP not reviewed this encounter.  Pending results:  Labs Reviewed - No data to display  Medications Ordered in UC: Medications - No data to display  Disposition Upon Discharge:  Condition: stable for discharge home Home: take medications as prescribed; routine discharge instructions as discussed; follow up as advised.  Patient presented with an acute illness with associated systemic symptoms and significant discomfort requiring urgent management. In my opinion, this is a condition that a prudent lay person (someone who possesses an average knowledge of health and medicine) may potentially expect to result in complications if not addressed urgently  such as respiratory distress, impairment of bodily function or dysfunction of bodily organs.   Routine symptom specific, illness  specific and/or disease specific instructions were discussed with the patient and/or caregiver at length.   As such, the patient has been evaluated and assessed, work-up was performed and treatment was provided in alignment with urgent care protocols and evidence based medicine.  Patient/parent/caregiver has been advised that the patient may require follow up for further testing and treatment if the symptoms continue in spite of treatment, as clinically indicated and appropriate.  If the patient was tested for COVID-19, Influenza and/or RSV, then the patient/parent/guardian was advised to isolate at home pending the results of his/her diagnostic coronavirus test and potentially longer if theyre positive. I have also advised pt that if his/her COVID-19 test returns positive, it's recommended to self-isolate for at least 10 days after symptoms first appeared AND until fever-free for 24 hours without fever reducer AND other symptoms have improved or resolved. Discussed self-isolation recommendations as well as instructions for household member/close contacts as per the [facility] and  DHHS, and also gave patient the [place] packet with this information.  Patient/parent/caregiver has been advised to return to the [facility] or PCP in 3-5 days if no better; to PCP or the Emergency Department if new signs and symptoms develop, or if the current signs or symptoms continue to change or worsen for further workup, evaluation and treatment as clinically indicated and appropriate  The patient will follow up with their current PCP if and as advised. If the patient does not currently have a PCP we will assist them in obtaining one.   The patient may need specialty follow up if the symptoms continue, in spite of conservative treatment and management, for further workup, evaluation, consultation and  treatment as clinically indicated and appropriate.   Patient/parent/caregiver verbalized understanding and agreement of plan as discussed.  All questions were addressed during visit.  Please see discharge instructions below for further details of plan.  Discharge Instructions:   Discharge Instructions      Once we receive the results of the x-ray of your right knee, we will advise you whether or not you need to be seen right away by orthopedics.  I have included the names of 2 orthopedic practices that take walk-in patients.  Please wear the stabilizing brace as often as you can to avoid movements that cause you pain.  Ibuprofen and rest are recommended for now.  Thank you for visiting urgent care, hopefully we can get your knee taken care of soon as possible.      This office note has been dictated using Museum/gallery curator.  Unfortunately, and despite my best efforts, this method of dictation can sometimes lead to occasional typographical or grammatical errors.  I apologize in advance if this occurs.     Lynden Oxford Scales, PA-C 06/01/21 1421

## 2021-06-01 NOTE — ED Triage Notes (Signed)
Pt presents with right knee pain after making a certain movement yesterday.

## 2021-06-01 NOTE — Discharge Instructions (Addendum)
Once we receive the results of the x-ray of your right knee, we will advise you whether or not you need to be seen right away by orthopedics.  I have included the names of 2 orthopedic practices that take walk-in patients.  Please wear the stabilizing brace as often as you can to avoid movements that cause you pain.  Ibuprofen and rest are recommended for now.  Thank you for visiting urgent care, hopefully we can get your knee taken care of soon as possible.

## 2021-06-02 DIAGNOSIS — M25561 Pain in right knee: Secondary | ICD-10-CM | POA: Diagnosis not present

## 2021-06-19 DIAGNOSIS — M25561 Pain in right knee: Secondary | ICD-10-CM | POA: Diagnosis not present

## 2021-07-03 ENCOUNTER — Encounter: Payer: Self-pay | Admitting: Family Medicine

## 2021-07-15 NOTE — Progress Notes (Deleted)
° °  New Patient Office Visit  Subjective:  Patient ID: Kristina Chambers, female    DOB: [birth year]  Age: 46 y.o. MRN: [id number]  CC: No chief complaint on file.   HPI [employer] presents for ***  Past Medical History:  Diagnosis Date   Anxiety    Chicken pox    Depression    Elevated cholesterol 2020   HPV in female    Lichen sclerosus 3382   Severe dysplasia of cervix (CIN III) 2021   Substance abuse ([place])     Past Surgical History:  Procedure Laterality Date   CLAVICLE SURGERY     GYNECOLOGIC CRYOSURGERY     LEEP  03/11/2020   CIN II, margins positive for CIN I    Family History  Problem Relation Age of Onset   Hypertension Mother    Colon polyps Mother    Lupus Maternal Grandmother    Colon cancer Maternal Grandmother    Colon cancer Paternal Grandfather    Stomach cancer Neg Hx    Esophageal cancer Neg Hx    Breast cancer Neg Hx     Social History   Socioeconomic History   Marital status: Single    Spouse name: Not on file   Number of children: Not on file   Years of education: Not on file   Highest education level: Not on file  Occupational History   Not on file  Tobacco Use   Smoking status: Former    Types: Cigarettes    Quit date: 06/14/2008    Years since quitting: 13.0   Smokeless tobacco: Never  Vaping Use   Vaping Use: Never used  Substance and Sexual Activity   Alcohol use: Not Currently   Drug use: Not Currently   Sexual activity: Yes    Birth control/protection: I.U.D.    Comment: Mirena insertion summer of 2017  Other Topics Concern   Not on file  Social History Narrative   Not on file   Social Determinants of Health   Financial Resource Strain: Not on file  Food Insecurity: Not on file  Transportation Needs: Not on file  Physical Activity: Not on file  Stress: Not on file  Social Connections: Not on file  Intimate Partner Violence: Not on file    ROS Review of Systems  Objective:   Today's Vitals: There were no  vitals taken for this visit.  Physical Exam  Assessment & Plan:   Problem List Items Addressed This Visit   None   Outpatient Encounter Medications as of 07/21/2021  Medication Sig   ARIPiprazole (ABILIFY) 15 MG tablet Take one tablet daily.   buPROPion (WELLBUTRIN XL) 300 MG 24 hr tablet TAKE 1 TABLET (300 MG) EVERY MORNING.   clobetasol ointment (TEMOVATE) 0.05 % Apply to area in a thin layer bid for 2 weeks as needed.  May apply once daily at night time, twice a week for maintenance dose.   escitalopram (LEXAPRO) 10 MG tablet Take 1 tablet (10 mg total) by mouth daily.   lamoTRIgine (LAMICTAL) 150 MG tablet Take 1 tablet (150 mg total) by mouth daily.   levonorgestrel (MIRENA) 20 MCG/24HR IUD by Intrauterine route.   lovastatin (MEVACOR) 20 MG tablet Take 1 tablet (20 mg total) by mouth at bedtime.   No facility-administered encounter medications on file as of 07/21/2021.    Follow-up: No follow-ups on file.   Aron Baba, [place]

## 2021-07-21 ENCOUNTER — Other Ambulatory Visit: Payer: Self-pay

## 2021-07-21 ENCOUNTER — Ambulatory Visit: Payer: Federal, State, Local not specified - PPO | Admitting: Physician Assistant

## 2021-07-22 NOTE — Progress Notes (Signed)
Medication Samples have been provided to the patient.  Drug name: Ozempic     Strength: 0.25        Qty: 1box  LOT: [id number]  Exp.Date: 10/12/23   Dosing instructions: inject 0.25mg  into the skin once weekly   The patient has been instructed regarding the correct time, dose, and frequency of taking this medication, including desired effects and most common side effects.   Kristina Chambers 1:40 PM 07/28/2021   New Patient Office Visit  Subjective:  Patient ID: Kristina Chambers, female    DOB: Oct 23, 1975  Age: 46 y.o. MRN: [id number]  CC:  Chief Complaint  Patient presents with   New Patient (Initial Visit)    HPI Kristina Chambers presents to establish care.  Patient has a past medical history of anxiety, depression, elevated cholesterol, and alcohol abuse.  Patient is followed by psychiatry and they currently manage Lexapro 10 mg, Wellbutrin XL 300 mg, Abilify 15 mg and Lamictal 150 mg.  States current mood treatment therapy is working well for her.  Takes lovastatin 20 mg for elevated cholesterol and has tolerated medication without issues.  Family history is pertinent for alcoholism (uncle), depression (mother), and hypertension (mother).  Patient reports quit smoking in 2010 and stopped drinking about 3 years ago.  Feels like her diet is fair and currently exercises 45 to 60 minutes 2 times per week which is usually cardio (elliptical or walking).  Recently joined walking/boot camp group. In the past was on Ozempic to help with weight loss and medication worked well and is interested on restarting medication again. Plans to start working diet changes. In the past has also joined [employer].   Past Medical History:  Diagnosis Date   Anxiety    Chicken pox    Depression    Elevated cholesterol 2020   HPV in female    Lichen sclerosus 2330   Severe dysplasia of cervix (CIN III) 2021   Substance abuse ([place])     Past Surgical History:  Procedure Laterality Date   CLAVICLE SURGERY      GYNECOLOGIC CRYOSURGERY     LEEP  03/11/2020   CIN II, margins positive for CIN I    Family History  Problem Relation Age of Onset   Hypertension Mother    Colon polyps Mother    Lupus Maternal Grandmother    Colon cancer Maternal Grandmother    Colon cancer Paternal Grandfather    Stomach cancer Neg Hx    Esophageal cancer Neg Hx    Breast cancer Neg Hx     Social History   Socioeconomic History   Marital status: Single    Spouse name: Not on file   Number of children: Not on file   Years of education: Not on file   Highest education level: Not on file  Occupational History   Not on file  Tobacco Use   Smoking status: Former    Types: Cigarettes    Quit date: 06/14/2008    Years since quitting: 13.1   Smokeless tobacco: Never  Vaping Use   Vaping Use: Never used  Substance and Sexual Activity   Alcohol use: Not Currently   Drug use: Not Currently   Sexual activity: Yes    Birth control/protection: I.U.D.    Comment: Mirena insertion summer of 2017  Other Topics Concern   Not on file  Social History Narrative   Not on file   Social Determinants of Health   Financial Resource Strain: Not  on file  Food Insecurity: Not on file  Transportation Needs: Not on file  Physical Activity: Not on file  Stress: Not on file  Social Connections: Not on file  Intimate Partner Violence: Not on file    ROS Review of Systems Review of Systems:  A fourteen system review of systems was performed and found to be positive as per HPI.  Objective:   Today's Vitals: BP 124/81    Pulse 99    Temp 98.1 F (36.7 C)    Ht 6\' 1"  (1.854 m)    Wt 287 lb (130.2 kg)    SpO2 99%    BMI 37.87 kg/m   Physical Exam General:  Well Developed, well nourished, appropriate for stated age.  Neuro:  Alert and oriented,  extra-ocular muscles intact  HEENT:  Normocephalic, atraumatic, neck supple  Skin:  no gross rash, warm, pink. Cardiac:  RRR, S1 S2 Respiratory: CTA B/L  Vascular:  Ext  warm, no cyanosis apprec.; cap RF less 2 sec. Psych:  No HI/SI, judgement and insight good, Euthymic mood. Full Affect.  Depression screen [facility] 2/9 07/27/2021 05/15/2021  Decreased Interest 0 0  Down, Depressed, Hopeless 0 0  PHQ - 2 Score 0 0  Altered sleeping 1 0  Tired, decreased energy 1 1  Change in appetite 1 1  Feeling bad or failure about yourself  1 0  Trouble concentrating 0 0  Moving slowly or fidgety/restless 0 0  Suicidal thoughts 0 0  PHQ-9 Score 4 2  Difficult doing work/chores Not difficult at all -   No flowsheet data found.   Assessment & Plan:   Problem List Items Addressed This Visit       Other   MDD (major depressive disorder)   GAD (generalized anxiety disorder)   Alcohol abuse   Hyperlipidemia   Other Visit Diagnoses     Encounter to establish care    -  Primary   Impaired fasting glucose       Relevant Medications   Semaglutide,0.25 or 0.5MG /DOS, (OZEMPIC, 0.25 OR 0.5 MG/DOSE,) 2 MG/1.5ML SOPN   Class 2 severe obesity with serious comorbidity and body mass index (BMI) of 37.0 to 37.9 in adult, unspecified obesity type (HCC)       Relevant Medications   Semaglutide,0.25 or 0.5MG /DOS, (OZEMPIC, 0.25 OR 0.5 MG/DOSE,) 2 MG/1.5ML SOPN      MDD: -PHQ-9 score of 4. -Followed by Psychiatry. Reviewed 12/05/2020 note. Lexapro 10 mg was added to regimen. -Continue current medication regimen.   GAD: -Followed by Psychiatry. Reviewed 12/05/2020. -Continue current medication regimen.  Hyperlipidemia: -Direct LDL 134 05/15/2021. -Continue Lovastatin 20 mg. -Discussed to continue weight loss efforts with dietary changes including reducing saturated and trans fats. -Will continue to monitor.  Alcohol abuse: -Praised patient for sobriety. Recommend to continue to abstain from alcohol.   Class 2 severe obesity with serious comorbidity and body mass index (BMI) of 37.0 to 37.9 in adult, unspecified obesity type: -Associated with hyperlipidemia and  impaired fasting glucose. Patient has cardiovascular risk factors and weight loss will help with reducing risk. Recommend to continue with increased physical activity and start making gradual dietary changes such as reducing fast food. With history of prediabetes patient will benefit from starting GLP-1 therapy to reduce progression to type 2 diabetes mellitus especially with obesity risk factor. Provided sample of Ozempic 0.25 mg to inject into the skin once a week x 4 weeks and sent rx for Ozempic 0.5 mg to start after completing  0.25 mg.  -Follow up in 4 weeks.    Outpatient Encounter Medications as of 07/27/2021  Medication Sig   ARIPiprazole (ABILIFY) 15 MG tablet Take one tablet daily.   buPROPion (WELLBUTRIN XL) 300 MG 24 hr tablet TAKE 1 TABLET (300 MG) EVERY MORNING.   clobetasol ointment (TEMOVATE) 0.05 % Apply to area in a thin layer bid for 2 weeks as needed.  May apply once daily at night time, twice a week for maintenance dose.   escitalopram (LEXAPRO) 10 MG tablet Take 1 tablet (10 mg total) by mouth daily.   lamoTRIgine (LAMICTAL) 150 MG tablet Take 1 tablet (150 mg total) by mouth daily.   levonorgestrel (MIRENA) 20 MCG/24HR IUD by Intrauterine route.   lovastatin (MEVACOR) 20 MG tablet Take 1 tablet (20 mg total) by mouth at bedtime.   Semaglutide,0.25 or 0.5MG /DOS, (OZEMPIC, 0.25 OR 0.5 MG/DOSE,) 2 MG/1.5ML SOPN Inject 0.5 mg into the skin once a week.   No facility-administered encounter medications on file as of 07/27/2021.    Follow-up: Return in about 4 weeks (around 08/24/2021) for weight.   Lorrene Reid, PA-C

## 2021-07-27 ENCOUNTER — Encounter: Payer: Self-pay | Admitting: Physician Assistant

## 2021-07-27 ENCOUNTER — Other Ambulatory Visit: Payer: Self-pay

## 2021-07-27 ENCOUNTER — Ambulatory Visit: Payer: Federal, State, Local not specified - PPO | Admitting: Physician Assistant

## 2021-07-27 VITALS — BP 124/81 | HR 99 | Temp 98.1°F | Ht 73.0 in | Wt 287.0 lb

## 2021-07-27 DIAGNOSIS — Z7689 Persons encountering health services in other specified circumstances: Secondary | ICD-10-CM | POA: Diagnosis not present

## 2021-07-27 DIAGNOSIS — F1010 Alcohol abuse, uncomplicated: Secondary | ICD-10-CM

## 2021-07-27 DIAGNOSIS — R7301 Impaired fasting glucose: Secondary | ICD-10-CM | POA: Diagnosis not present

## 2021-07-27 DIAGNOSIS — Z6837 Body mass index (BMI) 37.0-37.9, adult: Secondary | ICD-10-CM

## 2021-07-27 DIAGNOSIS — F339 Major depressive disorder, recurrent, unspecified: Secondary | ICD-10-CM

## 2021-07-27 DIAGNOSIS — F411 Generalized anxiety disorder: Secondary | ICD-10-CM

## 2021-07-27 DIAGNOSIS — E785 Hyperlipidemia, unspecified: Secondary | ICD-10-CM

## 2021-07-27 DIAGNOSIS — E66812 Obesity, class 2: Secondary | ICD-10-CM

## 2021-07-27 MED ORDER — OZEMPIC (0.25 OR 0.5 MG/DOSE) 2 MG/1.5ML ~~LOC~~ SOPN: 0.5000 mg | PEN_INJECTOR | SUBCUTANEOUS | 0 refills | Status: DC

## 2021-07-27 NOTE — Patient Instructions (Signed)

## 2021-08-24 NOTE — Progress Notes (Unsigned)
Established Patient Office Visit  Subjective:  Patient ID: Kristina Chambers, female    DOB: Apr 21, 1976  Age: 46 y.o. MRN: [id number]  CC: No chief complaint on file.   HPI [employer] presents for ***  Past Medical History:  Diagnosis Date   Anxiety    Chicken pox    Depression    Elevated cholesterol 2020   HPV in female    Lichen sclerosus 4970   Severe dysplasia of cervix (CIN III) 2021   Substance abuse ([place])     Past Surgical History:  Procedure Laterality Date   CLAVICLE SURGERY     GYNECOLOGIC CRYOSURGERY     LEEP  03/11/2020   CIN II, margins positive for CIN I    Family History  Problem Relation Age of Onset   Hypertension Mother    Colon polyps Mother    Lupus Maternal Grandmother    Colon cancer Maternal Grandmother    Colon cancer Paternal Grandfather    Stomach cancer Neg Hx    Esophageal cancer Neg Hx    Breast cancer Neg Hx     Social History   Socioeconomic History   Marital status: Single    Spouse name: Not on file   Number of children: Not on file   Years of education: Not on file   Highest education level: Not on file  Occupational History   Not on file  Tobacco Use   Smoking status: Former    Types: Cigarettes    Quit date: 06/14/2008    Years since quitting: 13.2   Smokeless tobacco: Never  Vaping Use   Vaping Use: Never used  Substance and Sexual Activity   Alcohol use: Not Currently   Drug use: Not Currently   Sexual activity: Yes    Birth control/protection: I.U.D.    Comment: Mirena insertion summer of 2017  Other Topics Concern   Not on file  Social History Narrative   Not on file   Social Determinants of Health   Financial Resource Strain: Not on file  Food Insecurity: Not on file  Transportation Needs: Not on file  Physical Activity: Not on file  Stress: Not on file  Social Connections: Not on file  Intimate Partner Violence: Not on file    Outpatient Medications Prior to Visit  Medication Sig Dispense  Refill   ARIPiprazole (ABILIFY) 15 MG tablet Take one tablet daily. 90 tablet 3   buPROPion (WELLBUTRIN XL) 300 MG 24 hr tablet TAKE 1 TABLET (300 MG) EVERY MORNING. 90 tablet 3   clobetasol ointment (TEMOVATE) 0.05 % Apply to area in a thin layer bid for 2 weeks as needed.  May apply once daily at night time, twice a week for maintenance dose. 30 g 0   escitalopram (LEXAPRO) 10 MG tablet Take 1 tablet (10 mg total) by mouth daily. 90 tablet 3   lamoTRIgine (LAMICTAL) 150 MG tablet Take 1 tablet (150 mg total) by mouth daily. 90 tablet 3   levonorgestrel (MIRENA) 20 MCG/24HR IUD by Intrauterine route.     lovastatin (MEVACOR) 20 MG tablet Take 1 tablet (20 mg total) by mouth at bedtime. 90 tablet 3   Semaglutide,0.25 or 0.'5MG'$ /DOS, (OZEMPIC, 0.25 OR 0.5 MG/DOSE,) 2 MG/1.5ML SOPN Inject 0.5 mg into the skin once a week. 1.5 mL 0   No facility-administered medications prior to visit.    No Known Allergies  ROS Review of Systems    Objective:    Physical Exam  There  were no vitals taken for this visit. Wt Readings from Last 3 Encounters:  07/27/21 287 lb (130.2 kg)  05/15/21 275 lb 3.2 oz (124.8 kg)  05/30/20 242 lb 9.6 oz (110 kg)     Health Maintenance Due  Topic Date Due   COVID-19 Vaccine (1) Never done   INFLUENZA VACCINE  Never done    There are no preventive care reminders to display for this patient.  Lab Results  Component Value Date   TSH 2.410 12/26/2018   Lab Results  Component Value Date   WBC 8.2 12/26/2018   HGB 13.0 12/26/2018   HCT 38.6 12/26/2018   MCV 87 12/26/2018   PLT 337 12/26/2018   Lab Results  Component Value Date   NA 137 05/15/2021   K 4.1 05/15/2021   CO2 27 05/15/2021   GLUCOSE 101 (H) 05/15/2021   BUN 9 05/15/2021   CREATININE 0.80 05/15/2021   BILITOT 0.5 05/15/2021   ALKPHOS 78 05/15/2021   AST 18 05/15/2021   ALT 35 05/15/2021   PROT 7.3 05/15/2021   ALBUMIN 4.1 05/15/2021   CALCIUM 9.3 05/15/2021   GFR 88.49 05/15/2021    Lab Results  Component Value Date   CHOL 191 05/15/2021   Lab Results  Component Value Date   HDL 42.40 05/15/2021   Lab Results  Component Value Date   LDLCALC 108 (H) 05/30/2020   Lab Results  Component Value Date   TRIG 221.0 (H) 05/15/2021   Lab Results  Component Value Date   CHOLHDL 4 05/15/2021   Lab Results  Component Value Date   HGBA1C 6.1 05/15/2021      Assessment & Plan:   Problem List Items Addressed This Visit   None   No orders of the defined types were placed in this encounter.   Follow-up: No follow-ups on file.    Aron Baba, [place]

## 2021-08-25 ENCOUNTER — Other Ambulatory Visit: Payer: Self-pay

## 2021-08-25 ENCOUNTER — Encounter: Payer: Self-pay | Admitting: Physician Assistant

## 2021-08-25 ENCOUNTER — Ambulatory Visit: Payer: Federal, State, Local not specified - PPO | Admitting: Physician Assistant

## 2021-08-25 VITALS — BP 110/74 | HR 82 | Temp 97.6°F | Ht 73.0 in | Wt 284.0 lb

## 2021-08-25 DIAGNOSIS — R7301 Impaired fasting glucose: Secondary | ICD-10-CM | POA: Diagnosis not present

## 2021-08-25 DIAGNOSIS — Z6837 Body mass index (BMI) 37.0-37.9, adult: Secondary | ICD-10-CM

## 2021-09-21 ENCOUNTER — Telehealth: Payer: Self-pay | Admitting: Physician Assistant

## 2021-09-21 NOTE — Telephone Encounter (Signed)
Patient is requesting a refill of Ozempic of the increased dosage. Also she has one pen with 2 of the 0.5 in it and wants to know if its ok to inject twice with that one until she can get the other at the pharmacy. [phone] ?

## 2021-09-22 NOTE — Telephone Encounter (Signed)
Patient is aware and has appointment for next week.  ?

## 2021-09-28 ENCOUNTER — Ambulatory Visit (INDEPENDENT_AMBULATORY_CARE_PROVIDER_SITE_OTHER): Payer: Federal, State, Local not specified - PPO | Admitting: Physician Assistant

## 2021-09-28 ENCOUNTER — Encounter: Payer: Self-pay | Admitting: Physician Assistant

## 2021-09-28 VITALS — BP 126/71 | HR 88 | Temp 97.6°F | Ht 73.0 in | Wt 286.0 lb

## 2021-09-28 DIAGNOSIS — Z6837 Body mass index (BMI) 37.0-37.9, adult: Secondary | ICD-10-CM

## 2021-09-28 MED ORDER — SEMAGLUTIDE (1 MG/DOSE) 4 MG/3ML ~~LOC~~ SOPN: 1.0000 mg | PEN_INJECTOR | SUBCUTANEOUS | 0 refills | Status: DC

## 2021-09-28 NOTE — Progress Notes (Signed)
? ?Established Patient Office Visit ? ?Subjective:  ?Patient ID: Kristina Chambers, female    DOB: 1976/05/05  Age: 46 y.o. MRN: [id number] ? ?CC:  ?Chief Complaint  ?Patient presents with  ? Weight Loss  ? ? ?HPI ?Kristina Chambers presents for follow-up on weight management. Patient started Ozempic 1 mg last Monday. Reports does not feel anything different with medication including no appetite suppressant. States is good during the day with monitoring her portion control and not as good in the evenings, usually has a bigger portion meal with more carbs. Patient reports hurt her right knee while working out and has an appointment tomorrow with Raliegh Ip.  ? ?Past Medical History:  ?Diagnosis Date  ? Anxiety   ? Chicken pox   ? Depression   ? Elevated cholesterol 2020  ? HPV in female   ? Lichen sclerosus 3244  ? Severe dysplasia of cervix (CIN III) 2021  ? Substance abuse ([place])   ? ? ?Past Surgical History:  ?Procedure Laterality Date  ? CLAVICLE SURGERY    ? GYNECOLOGIC CRYOSURGERY    ? LEEP  03/11/2020  ? CIN II, margins positive for CIN I  ? ? ?Family History  ?Problem Relation Age of Onset  ? Hypertension Mother   ? Colon polyps Mother   ? Lupus Maternal Grandmother   ? Colon cancer Maternal Grandmother   ? Colon cancer Paternal Grandfather   ? Stomach cancer Neg Hx   ? Esophageal cancer Neg Hx   ? Breast cancer Neg Hx   ? ? ?Social History  ? ?Socioeconomic History  ? Marital status: Single  ?  Spouse name: Not on file  ? Number of children: Not on file  ? Years of education: Not on file  ? Highest education level: Not on file  ?Occupational History  ? Not on file  ?Tobacco Use  ? Smoking status: Former  ?  Types: Cigarettes  ?  Quit date: 06/14/2008  ?  Years since quitting: 13.2  ? Smokeless tobacco: Never  ?Vaping Use  ? Vaping Use: Never used  ?Substance and Sexual Activity  ? Alcohol use: Not Currently  ? Drug use: Not Currently  ? Sexual activity: Yes  ?  Birth control/protection: I.U.D.  ?  Comment: Mirena  insertion summer of 2017  ?Other Topics Concern  ? Not on file  ?Social History Narrative  ? Not on file  ? ?Social Determinants of Health  ? ?Financial Resource Strain: Not on file  ?Food Insecurity: Not on file  ?Transportation Needs: Not on file  ?Physical Activity: Not on file  ?Stress: Not on file  ?Social Connections: Not on file  ?Intimate Partner Violence: Not on file  ? ? ?Outpatient Medications Prior to Visit  ?Medication Sig Dispense Refill  ? ARIPiprazole (ABILIFY) 15 MG tablet Take one tablet daily. 90 tablet 3  ? buPROPion (WELLBUTRIN XL) 300 MG 24 hr tablet TAKE 1 TABLET (300 MG) EVERY MORNING. 90 tablet 3  ? clobetasol ointment (TEMOVATE) 0.05 % Apply to area in a thin layer bid for 2 weeks as needed.  May apply once daily at night time, twice a week for maintenance dose. 30 g 0  ? escitalopram (LEXAPRO) 10 MG tablet Take 1 tablet (10 mg total) by mouth daily. 90 tablet 3  ? lamoTRIgine (LAMICTAL) 150 MG tablet Take 1 tablet (150 mg total) by mouth daily. 90 tablet 3  ? levonorgestrel (MIRENA) 20 MCG/24HR IUD by Intrauterine route.    ?  lovastatin (MEVACOR) 20 MG tablet Take 1 tablet (20 mg total) by mouth at bedtime. 90 tablet 3  ? Semaglutide,0.25 or 0.'5MG'$ /DOS, (OZEMPIC, 0.25 OR 0.5 MG/DOSE,) 2 MG/1.5ML SOPN Inject 0.5 mg into the skin once a week. 1.5 mL 0  ? ?No facility-administered medications prior to visit.  ? ? ?No Known Allergies ? ?ROS ?Review of Systems ?Review of Systems:  ?A fourteen system review of systems was performed and found to be positive as per HPI. ? ?  ?Objective:  ?  ?Physical Exam ?General:  Well Developed, well nourished, appropriate for stated age.  ?Neuro:  Alert and oriented,  extra-ocular muscles intact  ?HEENT:  Normocephalic, atraumatic, neck supple  ?Skin:  no gross rash, warm, pink. ?Cardiac:  RRR, S1 S2 ?Respiratory: CTA B/L  ?Vascular:  Ext warm, no cyanosis apprec.; cap RF less 2 sec. ?Psych:  No HI/SI, judgement and insight good, Euthymic mood. Full  Affect. ? ?BP 126/71   Pulse 88   Temp 97.6 ?F (36.4 ?C)   Ht '6\' 1"'$  (1.854 m)   Wt 286 lb (129.7 kg)   SpO2 98%   BMI 37.73 kg/m?  ?Wt Readings from Last 3 Encounters:  ?09/28/21 286 lb (129.7 kg)  ?08/25/21 284 lb (128.8 kg)  ?07/27/21 287 lb (130.2 kg)  ? ? ? ?Health Maintenance Due  ?Topic Date Due  ? COVID-19 Vaccine (4 - Booster for Moderna series) 07/23/2020  ? ? ?There are no preventive care reminders to display for this patient. ? ?Lab Results  ?Component Value Date  ? TSH 2.410 12/26/2018  ? ?Lab Results  ?Component Value Date  ? WBC 8.2 12/26/2018  ? HGB 13.0 12/26/2018  ? HCT 38.6 12/26/2018  ? MCV 87 12/26/2018  ? PLT 337 12/26/2018  ? ?Lab Results  ?Component Value Date  ? NA 137 05/15/2021  ? K 4.1 05/15/2021  ? CO2 27 05/15/2021  ? GLUCOSE 101 (H) 05/15/2021  ? BUN 9 05/15/2021  ? CREATININE 0.80 05/15/2021  ? BILITOT 0.5 05/15/2021  ? ALKPHOS 78 05/15/2021  ? AST 18 05/15/2021  ? ALT 35 05/15/2021  ? PROT 7.3 05/15/2021  ? ALBUMIN 4.1 05/15/2021  ? CALCIUM 9.3 05/15/2021  ? GFR 88.49 05/15/2021  ? ?Lab Results  ?Component Value Date  ? CHOL 191 05/15/2021  ? ?Lab Results  ?Component Value Date  ? HDL 42.40 05/15/2021  ? ?Lab Results  ?Component Value Date  ? LDLCALC 108 (H) 05/30/2020  ? ?Lab Results  ?Component Value Date  ? TRIG 221.0 (H) 05/15/2021  ? ?Lab Results  ?Component Value Date  ? CHOLHDL 4 05/15/2021  ? ?Lab Results  ?Component Value Date  ? HGBA1C 6.1 05/15/2021  ? ? ?  ?Assessment & Plan:  ? ?Problem List Items Addressed This Visit   ? ?  ? Other  ? Class 2 severe obesity with serious comorbidity and body mass index (BMI) of 37.0 to 37.9 in adult [facility]) - Primary  ?  -Patient has cardiovascular risk factors (impaired fasting glucose) and recommend to continue weight loss efforts with diet and lifestyle changes to reduce risk. Patient has gained 2 pounds since last visit. Discussed will continue with Ozempic 1 mg and if 5% weight loss has not been achieved after being on medication  therapy for 12 weeks then recommend changing to [facility]. Pt verbalized understanding and agreeable.  ? ?  ?  ? Relevant Medications  ? Semaglutide, 1 MG/DOSE, 4 MG/3ML SOPN  ? ? ?Meds ordered this  encounter  ?Medications  ? Semaglutide, 1 MG/DOSE, 4 MG/3ML SOPN  ?  Sig: Inject 1 mg as directed once a week.  ?  Dispense:  3 mL  ?  Refill:  0  ?  Order Specific Question:   Supervising Provider  ?  Answer:   Beatrice Lecher D [[id number]]  ? ? ?Follow-up: Return in about 4 weeks (around 10/26/2021) for Wt.  ? ? ?Lorrene Reid, PA-C ?

## 2021-09-28 NOTE — Assessment & Plan Note (Signed)
-  Patient has cardiovascular risk factors (impaired fasting glucose) and recommend to continue weight loss efforts with diet and lifestyle changes to reduce risk. Patient has gained 2 pounds since last visit. Discussed will continue with Ozempic 1 mg and if 5% weight loss has not been achieved after being on medication therapy for 12 weeks then recommend changing to [facility]. Pt verbalized understanding and agreeable.  ?

## 2021-09-29 DIAGNOSIS — M25561 Pain in right knee: Secondary | ICD-10-CM | POA: Diagnosis not present

## 2021-10-12 NOTE — Progress Notes (Signed)
46 y.o. G38P0001 Single Caucasian female here for annual exam.   ? ?Has occasional spotting with Mirena IUD.  ? ?Not sexually active.  ?Declines STD screening.  ? ?Using clobetasol infrequently.  ?Does not need refill of Clobetasol.  ? ?Taking Ozempic for weight loss, and it is not helping her patient.  ?Her PCP is prescribing.  ? ?PCP:  Lorrene Reid, PA-C ? ?No LMP recorded. (Menstrual status: IUD).     ?Period Cycle (Days):  (no cycles with Mirena IUD) ?    ?Sexually active: No.  ?The current method of family planning is IUD--Mirena 11/2015.    ?Exercising: Yes.     [employer] ?Smoker:  Former ? ?Health Maintenance: ?Pap:  09-24-20 Neg:Neg HR HPV, 01-23-20 Neg:Pos HR HPV, 12-26-18 ASCUS:Pos HR HPV ?History of abnormal Pap:  Yes, 03-11-20 LEEP CIN II and negative margins for HGSIL but positive margins with LGSIL.  Her endocervical button was normal, and her ECC was also normal and free of dysplasia. 02-20-21 colpo CIN II-III. 01-23-20 Neg:Pos HR HPV. 12-26-18 ASCUS:Pos HR HPV--colpo revealed LGSIL--no treatment, Hx of cryotherapy to cervix ?MMG:  03-28-21 Neg/BiRads1 ?Colonoscopy:   09-26-19 polyps:next due;next 09/2024 ?BMD:   n/a  Result  n/a ?TDaP:  12-26-18 ?Gardasil:   yes, completed ?HIV: Neg in the past ?Hep C: donated blood  ?Screening Labs:  PCP ? ? reports that she quit smoking about 13 years ago. Her smoking use included cigarettes. She has never used smokeless tobacco. She reports that she does not currently use alcohol. She reports that she does not currently use drugs. ? ?Past Medical History:  ?Diagnosis Date  ? Anxiety   ? Chicken pox   ? Depression   ? Elevated cholesterol 2020  ? HPV in female   ? Lichen sclerosus 3151  ? Severe dysplasia of cervix (CIN III) 2021  ? Substance abuse ([place])   ? ? ?Past Surgical History:  ?Procedure Laterality Date  ? CLAVICLE SURGERY    ? GYNECOLOGIC CRYOSURGERY    ? LEEP  03/11/2020  ? CIN II, margins positive for CIN I  ? ? ?Current Outpatient Medications  ?Medication Sig  Dispense Refill  ? ARIPiprazole (ABILIFY) 15 MG tablet Take one tablet daily. 90 tablet 3  ? buPROPion (WELLBUTRIN XL) 300 MG 24 hr tablet TAKE 1 TABLET (300 MG) EVERY MORNING. 90 tablet 3  ? escitalopram (LEXAPRO) 10 MG tablet Take 1 tablet (10 mg total) by mouth daily. 90 tablet 3  ? lamoTRIgine (LAMICTAL) 150 MG tablet Take 1 tablet (150 mg total) by mouth daily. 90 tablet 3  ? levonorgestrel (MIRENA) 20 MCG/24HR IUD by Intrauterine route.    ? lovastatin (MEVACOR) 20 MG tablet Take 1 tablet (20 mg total) by mouth at bedtime. 90 tablet 3  ? Semaglutide, 1 MG/DOSE, 4 MG/3ML SOPN Inject 1 mg as directed once a week. 3 mL 0  ? clobetasol ointment (TEMOVATE) 0.05 % Apply to area in a thin layer bid for 2 weeks as needed.  May apply once daily at night time, twice a week for maintenance dose. (Patient not taking: Reported on 10/15/2021) 30 g 0  ? ?No current facility-administered medications for this visit.  ? ? ?Family History  ?Problem Relation Age of Onset  ? Hypertension Mother   ? Colon polyps Mother   ? Lupus Maternal Grandmother   ? Colon cancer Maternal Grandmother   ? Colon cancer Paternal Grandfather   ? Stomach cancer Neg Hx   ? Esophageal cancer Neg Hx   ?  Breast cancer Neg Hx   ? ? ?Review of Systems  ?All other systems reviewed and are negative. ? ?Exam:   ?BP 110/62   Pulse 84   Ht 6' (1.829 m)   Wt 283 lb (128.4 kg)   SpO2 97%   BMI 38.38 kg/m?     ?General appearance: alert, cooperative and appears stated age ?Head: normocephalic, without obvious abnormality, atraumatic ?Neck: no adenopathy, supple, symmetrical, trachea midline and thyroid normal to inspection and palpation ?Lungs: clear to auscultation bilaterally ?Breasts: normal appearance, no masses or tenderness, No nipple retraction or dimpling, No nipple discharge or bleeding, No axillary adenopathy ?Heart: regular rate and rhythm ?Abdomen: soft, non-tender; no masses, no organomegaly ?Extremities: extremities normal, atraumatic, no cyanosis  or edema ?Skin: skin color, texture, turgor normal. No rashes or lesions ?Lymph nodes: cervical, supraclavicular, and axillary nodes normal. ?Neurologic: grossly normal ? ?Pelvic: External genitalia:  hypopigmentation patch of superior and right vulva. ?             No abnormal inguinal nodes palpated. ?             Urethra:  normal appearing urethra with no masses, tenderness or lesions ?             Bartholins and Skenes: normal    ?             Vagina: normal appearing vagina with normal color and discharge, no lesions ?             Cervix: no lesions.  IUD strings not seen. ?             Pap taken: yes ?Bimanual Exam:  Uterus:  normal size, contour, position, consistency, mobility, non-tender ?             Adnexa: no mass, fullness, tenderness ?             Rectal exam: yes.  Confirms. ?             Anus:  normal sphincter tone, perianal hypopigmentation. ? ?Chaperone was present for exam:  Estill Bamberg, CMA ? ?Assessment:   ?Well woman visit with gynecologic exam. ?Mirena IUD.   IUD strings not seen.  ?Hx CIN III on colposcopy, status post LEEP.  ?Lichen sclerosus.  ?Vulvar lesion.  Perianal changes.  ? ?Plan: ?Mammogram screening discussed. ?Self breast awareness reviewed. ?Pap and HR HPV as above. ?Guidelines for Calcium, Vitamin D, regular exercise program including cardiovascular and weight bearing exercise. ?Return for vulvar biopsy.  ?Return for pelvic US to check IUD position.  ?Follow up annually and prn.  ? ?After visit summary provided.  ? ? ? ?

## 2021-10-15 ENCOUNTER — Other Ambulatory Visit (HOSPITAL_COMMUNITY)
Admission: RE | Admit: 2021-10-15 | Discharge: 2021-10-15 | Disposition: A | Discharge status: Home / Self Care | Payer: Federal, State, Local not specified - PPO | Source: Ambulatory Visit | Attending: Obstetrics and Gynecology | Admitting: Obstetrics and Gynecology

## 2021-10-15 ENCOUNTER — Ambulatory Visit (INDEPENDENT_AMBULATORY_CARE_PROVIDER_SITE_OTHER): Payer: Federal, State, Local not specified - PPO | Admitting: Obstetrics and Gynecology

## 2021-10-15 ENCOUNTER — Encounter: Payer: Self-pay | Admitting: Obstetrics and Gynecology

## 2021-10-15 VITALS — BP 110/62 | HR 84 | Ht 72.0 in | Wt 283.0 lb

## 2021-10-15 DIAGNOSIS — N9089 Other specified noninflammatory disorders of vulva and perineum: Secondary | ICD-10-CM | POA: Diagnosis not present

## 2021-10-15 DIAGNOSIS — Z124 Encounter for screening for malignant neoplasm of cervix: Secondary | ICD-10-CM

## 2021-10-15 DIAGNOSIS — Z01419 Encounter for gynecological examination (general) (routine) without abnormal findings: Secondary | ICD-10-CM | POA: Diagnosis not present

## 2021-10-15 DIAGNOSIS — Z87410 Personal history of cervical dysplasia: Secondary | ICD-10-CM | POA: Diagnosis not present

## 2021-10-15 DIAGNOSIS — T8332XA Displacement of intrauterine contraceptive device, initial encounter: Secondary | ICD-10-CM

## 2021-10-15 NOTE — Patient Instructions (Signed)

## 2021-10-16 NOTE — Progress Notes (Signed)
GYNECOLOGY  VISIT   HPI: 46 y.o.   Single  Caucasian  female   G1P0001 with No LMP recorded. (Menstrual status: IUD).   here for Vulvar Bx for right vulvar hypopigmentation patch noted at her last office visit.    Hx perineal lichen sclerosus.   GYNECOLOGIC HISTORY: No LMP recorded. (Menstrual status: IUD). Contraception:  Mirena 11-2015 Menopausal hormone therapy:  None Last mammogram:  03-28-21 normal Density Cat.B BI RADS 1 Last pap smear:   10/15/21 - neg, neg HR HPV, 09-24-20 normal Neg HPV        OB History     Gravida  1   Para  1   Term  0   Preterm  0   AB  0   Living  1      SAB  0   IAB  0   Ectopic  0   Multiple  0   Live Births  0              Patient Active Problem List   Diagnosis Date Noted   Class 2 severe obesity with serious comorbidity and body mass index (BMI) of 37.0 to 37.9 in adult [facility]) 09/28/2021   Hyperlipidemia 01/15/2020   Class 2 obesity with body mass index (BMI) of 36.0 to 36.9 in adult 01/15/2020   Alcohol abuse 03/12/2019   MDD (major depressive disorder) 02/21/2019   GAD (generalized anxiety disorder) 02/21/2019    Past Medical History:  Diagnosis Date   Anxiety    Chicken pox    Depression    Elevated cholesterol 2020   HPV in female    Lichen sclerosus 7867   Severe dysplasia of cervix (CIN III) 2021   Substance abuse ([place])     Past Surgical History:  Procedure Laterality Date   CLAVICLE SURGERY     GYNECOLOGIC CRYOSURGERY     LEEP  03/11/2020   CIN II, margins positive for CIN I    Current Outpatient Medications  Medication Sig Dispense Refill   ARIPiprazole (ABILIFY) 15 MG tablet Take one tablet daily. 90 tablet 3   buPROPion (WELLBUTRIN XL) 300 MG 24 hr tablet TAKE 1 TABLET (300 MG) EVERY MORNING. 90 tablet 3   clobetasol ointment (TEMOVATE) 0.05 % Apply to area in a thin layer bid for 2 weeks as needed.  May apply once daily at night time, twice a week for maintenance dose. 30 g 0   escitalopram  (LEXAPRO) 10 MG tablet Take 1 tablet (10 mg total) by mouth daily. 90 tablet 3   lamoTRIgine (LAMICTAL) 150 MG tablet Take 1 tablet (150 mg total) by mouth daily. 90 tablet 3   levonorgestrel (MIRENA) 20 MCG/24HR IUD by Intrauterine route.     lovastatin (MEVACOR) 20 MG tablet Take 1 tablet (20 mg total) by mouth at bedtime. 90 tablet 3   Semaglutide-Weight Management (WEGOVY) 0.25 MG/0.5ML SOAJ Inject 0.25 mg into the skin once a week. (Patient not taking: Reported on 10/30/2021) 2 mL 0   No current facility-administered medications for this visit.     ALLERGIES: Patient has no known allergies.  Family History  Problem Relation Age of Onset   Hypertension Mother    Colon polyps Mother    Lupus Maternal Grandmother    Colon cancer Maternal Grandmother    Colon cancer Paternal Grandfather    Stomach cancer Neg Hx    Esophageal cancer Neg Hx    Breast cancer Neg Hx     Social History  Socioeconomic History   Marital status: Single    Spouse name: Not on file   Number of children: Not on file   Years of education: Not on file   Highest education level: Not on file  Occupational History   Not on file  Tobacco Use   Smoking status: Former    Types: Cigarettes    Quit date: 06/14/2008    Years since quitting: 13.3   Smokeless tobacco: Never  Vaping Use   Vaping Use: Never used  Substance and Sexual Activity   Alcohol use: Not Currently   Drug use: Not Currently   Sexual activity: Not Currently    Birth control/protection: I.U.D.    Comment: Mirena insertion summer of 2017  Other Topics Concern   Not on file  Social History Narrative   Not on file   Social Determinants of Health   Financial Resource Strain: Not on file  Food Insecurity: Not on file  Transportation Needs: Not on file  Physical Activity: Not on file  Stress: Not on file  Social Connections: Not on file  Intimate Partner Violence: Not on file    Review of Systems  See HPI.   PHYSICAL EXAMINATION:     BP 116/70 (BP Location: Right Arm, Patient Position: Sitting, Cuff Size: Normal)     General appearance: alert, cooperative and appears stated age   Pelvic: External genitalia:  2 cm well demarcated patch of hypopigmentation of the right superior labia minora.  Hypopigmentation of the perineum also.               Urethra:  normal appearing urethra with no masses, tenderness or lesions          Vulvar biopsy.  Right superior labia minora.  Consent done.  Hibiclens prep. Local 1% lidocaine, lot [id number], exp 01/13/24. 4 mm punch biopsy.  Single 3/0 vicryl suture. Minimal EBL.  No complications.   Chaperone was present for exam:  Maudie Mercury, CMA  ASSESSMENT  Vulvar lesion.  Hx lichen sclerosus.   PLAN  FU biopsy.  I anticipated use of her clobetasol ointment on the right vulvar lesion. Office visit in 10 days if suture is causing irritation.  FU prn.    An After Visit Summary was printed and given to the patient.

## 2021-10-20 LAB — CYTOLOGY - PAP
Comment: NEGATIVE
Diagnosis: NEGATIVE
Diagnosis: REACTIVE
High risk HPV: NEGATIVE

## 2021-10-26 ENCOUNTER — Ambulatory Visit: Payer: Federal, State, Local not specified - PPO | Admitting: Physician Assistant

## 2021-10-30 ENCOUNTER — Ambulatory Visit (INDEPENDENT_AMBULATORY_CARE_PROVIDER_SITE_OTHER): Payer: Federal, State, Local not specified - PPO | Admitting: Physician Assistant

## 2021-10-30 ENCOUNTER — Other Ambulatory Visit (HOSPITAL_COMMUNITY)
Admission: RE | Admit: 2021-10-30 | Discharge: 2021-10-30 | Disposition: A | Discharge status: Home / Self Care | Payer: Federal, State, Local not specified - PPO | Source: Ambulatory Visit | Attending: Obstetrics and Gynecology | Admitting: Obstetrics and Gynecology

## 2021-10-30 ENCOUNTER — Encounter: Payer: Self-pay | Admitting: Physician Assistant

## 2021-10-30 ENCOUNTER — Ambulatory Visit (INDEPENDENT_AMBULATORY_CARE_PROVIDER_SITE_OTHER): Payer: Federal, State, Local not specified - PPO | Admitting: Obstetrics and Gynecology

## 2021-10-30 VITALS — BP 116/70

## 2021-10-30 VITALS — BP 102/70 | HR 88 | Temp 97.7°F | Ht 73.0 in | Wt 285.0 lb

## 2021-10-30 DIAGNOSIS — N9089 Other specified noninflammatory disorders of vulva and perineum: Secondary | ICD-10-CM | POA: Diagnosis not present

## 2021-10-30 DIAGNOSIS — Z6837 Body mass index (BMI) 37.0-37.9, adult: Secondary | ICD-10-CM

## 2021-10-30 MED ORDER — WEGOVY 0.25 MG/0.5ML ~~LOC~~ SOAJ: 0.2500 mg | SUBCUTANEOUS | 0 refills | Status: DC

## 2021-10-30 NOTE — Patient Instructions (Signed)

## 2021-10-30 NOTE — Progress Notes (Addendum)
Established patient visit   Patient: Kristina Chambers   DOB: [birth year]   46 y.o. Female  MRN: [id number] Visit Date: 10/30/2021  Chief Complaint  Patient presents with   Follow-up   Subjective    HPI  Patient presents for follow-up on weight management. Patient reports tolerating Ozempic 1 mg without issues but has not noticed a significant difference with her appetite. Still struggling to control her appetite. Continues to stay active with cardio but the last two week has not been able to exercise as routinely.     Medications: Outpatient Medications Prior to Visit  Medication Sig   ARIPiprazole (ABILIFY) 15 MG tablet Take one tablet daily.   buPROPion (WELLBUTRIN XL) 300 MG 24 hr tablet TAKE 1 TABLET (300 MG) EVERY MORNING.   escitalopram (LEXAPRO) 10 MG tablet Take 1 tablet (10 mg total) by mouth daily.   lamoTRIgine (LAMICTAL) 150 MG tablet Take 1 tablet (150 mg total) by mouth daily.   levonorgestrel (MIRENA) 20 MCG/24HR IUD by Intrauterine route.   lovastatin (MEVACOR) 20 MG tablet Take 1 tablet (20 mg total) by mouth at bedtime.   [DISCONTINUED] Semaglutide, 1 MG/DOSE, 4 MG/3ML SOPN Inject 1 mg as directed once a week.   clobetasol ointment (TEMOVATE) 0.05 % Apply to area in a thin layer bid for 2 weeks as needed.  May apply once daily at night time, twice a week for maintenance dose. (Patient not taking: Reported on 10/15/2021)   No facility-administered medications prior to visit.    Review of Systems Review of Systems:  A fourteen system review of systems was performed and found to be positive as per HPI.  Last CBC Lab Results  Component Value Date   WBC 8.2 12/26/2018   HGB 13.0 12/26/2018   HCT 38.6 12/26/2018   MCV 87 12/26/2018   MCH 29.3 12/26/2018   RDW 11.7 12/26/2018   PLT 337 67/89/3810   Last metabolic panel Lab Results  Component Value Date   GLUCOSE 101 (H) 05/15/2021   NA 137 05/15/2021   K 4.1 05/15/2021   CL 104 05/15/2021   CO2 27  05/15/2021   BUN 9 05/15/2021   CREATININE 0.80 05/15/2021   GFRNONAA 82 05/30/2020   CALCIUM 9.3 05/15/2021   PROT 7.3 05/15/2021   ALBUMIN 4.1 05/15/2021   LABGLOB 2.5 12/26/2018   AGRATIO 1.8 12/26/2018   BILITOT 0.5 05/15/2021   ALKPHOS 78 05/15/2021   AST 18 05/15/2021   ALT 35 05/15/2021   Last lipids Lab Results  Component Value Date   CHOL 191 05/15/2021   HDL 42.40 05/15/2021   LDLCALC 108 (H) 05/30/2020   LDLDIRECT 134.0 05/15/2021   TRIG 221.0 (H) 05/15/2021   CHOLHDL 4 05/15/2021   Last hemoglobin A1c Lab Results  Component Value Date   HGBA1C 6.1 05/15/2021   Last thyroid functions Lab Results  Component Value Date   TSH 2.410 12/26/2018   Last vitamin D No results found for: 25OHVITD2, 25OHVITD3, VD25OH   Objective    BP 102/70   Pulse 88   Temp 97.7 F (36.5 C)   Ht '6\' 1"'$  (1.854 m)   Wt 285 lb (129.3 kg)   SpO2 96%   BMI 37.60 kg/m  BP Readings from Last 3 Encounters:  10/30/21 102/70  10/15/21 110/62  09/28/21 126/71   Wt Readings from Last 3 Encounters:  10/30/21 285 lb (129.3 kg)  10/15/21 283 lb (128.4 kg)  09/28/21 286 lb (129.7 kg)    Physical Exam  General:  Pleasant and cooperative, appropriate for stated age.  Neuro:  Alert and oriented,  extra-ocular muscles intact  HEENT:  Normocephalic, atraumatic, neck supple  Skin:  no gross rash, warm, pink. Cardiac:  RRR, S1 S2, no MRG Respiratory: CTA B/L  Vascular:  Ext warm, no cyanosis apprec.; cap RF less 2 sec. Psych:  No HI/SI, judgement and insight good, Euthymic mood. Full Affect.   No results found for any visits on 10/30/21.  Assessment & Plan      Problem List Items Addressed This Visit       Other   Class 2 severe obesity with serious comorbidity and body mass index (BMI) of 37.0 to 37.9 in adult [facility]) - Primary   Relevant Medications   Semaglutide-Weight Management (WEGOVY) 0.25 MG/0.5ML SOAJ   Class 2 severe obesity with serious comorbidity and body mass  index (BMI) of 37.0 to 37.9 in adult: -Associated with hyperlipidemia and impaired fasting glucose. Patient has cardiovascular risk factors and weight loss will help with reducing risk. Patient has lost less than 5% with medication therapy which she has been on x 12 weeks so recommend changing from Ozempic to [facility]. Patient is agreeable. In the past has also tried Weight Watcher's which was unsuccessful. Recommend to continue working on diet changes, portion control and exercise. Follow-up in 4 weeks.   Return in about 4 weeks (around 11/27/2021) for Wt- pt will call to make appt.        Lorrene Reid, PA-C  [facility] at [facility] [phone] (phone) [phone] (fax)  [place]

## 2021-10-30 NOTE — Patient Instructions (Signed)
Vulva Biopsy, Care After The following information offers guidance on how to care for yourself after your procedure. Your health care provider may also give you more specific instructions. If you have problems or questions, contact your health care provider. What can I expect after the procedure? After the procedure, it is common to have: Slight bleeding from the biopsy site. Slight pain or discomfort at the biopsy site. Follow these instructions at home: Biopsy site care  Follow instructions from your health care provider about how to take care of your biopsy site. Make sure you: Clean the area using water and mild soap twice a day or as told by your health care provider. Gently pat the area dry. You may shower 24 hours after the procedure. If you were prescribed an antibiotic ointment, apply it as told by your health care provider. Do not stop using the antibiotic even if your condition improves. If told by your health care provider, take a sitz bath to help with pain and discomfort. This is a warm water bath that you take while sitting down. Do this as often as told by your health care provider. The water should only come up to your hips and cover your buttocks. You may pat the area dry with a soft, clean towel. Leave stitches (sutures), skin glue, or adhesive strips in place. These skin closures may need to stay in place for 2 weeks or longer. If adhesive strip edges start to loosen and curl up, you may trim the loose edges. Do not remove adhesive strips completely unless your health care provider tells you to do that. Check your biopsy site every day for signs of infection. It may be helpful to use a handheld mirror to do this. Check for: Redness, swelling, or more pain. More fluid or blood. Warmth. Pus or a bad smell. Do not rub the biopsy area after urinating. Gently pat the area dry or use a bottle filled with warm water (peri bottle) to clean the area. Gently wipe from front to  back. Lifestyle Wear loose, cotton underwear. Do not wear tight pants. For at least 1 week or until your health care provider approves: Do not use tampons, douche, or put anything inside your vagina. Do not have sex. Until your health care provider approves: Do not exercise, such as running or biking. Do not swim or use a hot tub. General instructions Take over-the-counter and prescription medicines only as told by your health care provider. Drink enough fluid to keep your urine pale yellow. Use a sanitary napkin until the bleeding stops. If told, put ice on the biopsy site. To do this: Place ice in a plastic bag. Place a towel between your skin and the bag. Leave the ice on for 20 minutes, 2-3 times a day. Remove the ice if your skin turns bright red. This is very important. If you cannot feel pain, heat, or cold, you have a greater risk of damage to the area. Keep all follow-up visits. This is important. Contact a health care provider if: You have redness, swelling, or more pain around your biopsy site. You have more fluid or blood coming from your biopsy site. Your biopsy site feels warm to the touch. Your pain is not controlled with medicine or ice packs. You have a fever or chills. Get help right away if: You have heavy bleeding from the vulva. You have pus or a bad smell coming from the biopsy site. You have abdominal pain. Summary After the procedure, it  is common to have slight bleeding and discomfort at the biopsy site. Follow instructions from your health care provider after your biopsy. Take sitz baths as told by your health care provider to help with pain and discomfort. Leave any sutures in place. Contact your health care provider if you notice any signs of infection around the biopsy site, including redness, swelling, more pain, more fluid or blood, or warmth. Keep all follow-up visits. This is important. This information is not intended to replace advice given to you  by your health care provider. Make sure you discuss any questions you have with your health care provider. Document Revised: 02/17/2021 Document Reviewed: 02/17/2021 Elsevier Patient Education  [place].

## 2021-11-01 ENCOUNTER — Encounter: Payer: Self-pay | Admitting: Obstetrics and Gynecology

## 2021-11-05 LAB — SURGICAL PATHOLOGY

## 2021-11-16 NOTE — Progress Notes (Signed)
GYNECOLOGY  VISIT   HPI: 46 y.o.   Single  Caucasian  female   G1P0001 with No LMP recorded. (Menstrual status: IUD).   here for pelvic ultrasound.   Has Mirena IUD and strings are lost.    Recent biopsy of vulva showed lichen sclerosus.  Needs refill of Clobetasol ointment.   GYNECOLOGIC HISTORY: No LMP recorded. (Menstrual status: IUD). Contraception:  Mirena 10/2015 Menopausal hormone therapy:  None Last mammogram:  03-28-21 Neg/BiRads1 Last pap smear:   10-15-21 Neg:Neg HR HPV, 09-24-20 Neg:Neg HR HPV, 01-23-20 Neg:Pos HR HPV        OB History     Gravida  1   Para  1   Term  0   Preterm  0   AB  0   Living  1      SAB  0   IAB  0   Ectopic  0   Multiple  0   Live Births  0              Patient Active Problem List   Diagnosis Date Noted   Class 2 severe obesity with serious comorbidity and body mass index (BMI) of 37.0 to 37.9 in adult [facility]) 09/28/2021   Hyperlipidemia 01/15/2020   Class 2 obesity with body mass index (BMI) of 36.0 to 36.9 in adult 01/15/2020   Alcohol abuse 03/12/2019   MDD (major depressive disorder) 02/21/2019   GAD (generalized anxiety disorder) 02/21/2019    Past Medical History:  Diagnosis Date   Anxiety    Chicken pox    Depression    Elevated cholesterol 2020   HPV in female    Lichen sclerosus 4656   Severe dysplasia of cervix (CIN III) 2021   Substance abuse ([place])     Past Surgical History:  Procedure Laterality Date   CLAVICLE SURGERY     GYNECOLOGIC CRYOSURGERY     LEEP  03/11/2020   CIN II, margins positive for CIN I    Current Outpatient Medications  Medication Sig Dispense Refill   ARIPiprazole (ABILIFY) 15 MG tablet Take one tablet daily. 90 tablet 3   buPROPion (WELLBUTRIN XL) 300 MG 24 hr tablet TAKE 1 TABLET (300 MG) EVERY MORNING. 90 tablet 3   clobetasol ointment (TEMOVATE) 0.05 % Apply to area in a thin layer bid for 2 weeks as needed.  May apply once daily at night time, twice a week for  maintenance dose. 30 g 0   escitalopram (LEXAPRO) 10 MG tablet Take 1 tablet (10 mg total) by mouth daily. 90 tablet 3   lamoTRIgine (LAMICTAL) 150 MG tablet Take 1 tablet (150 mg total) by mouth daily. 90 tablet 3   levonorgestrel (MIRENA) 20 MCG/24HR IUD by Intrauterine route.     lovastatin (MEVACOR) 20 MG tablet Take 1 tablet (20 mg total) by mouth at bedtime. 90 tablet 3   Semaglutide-Weight Management (WEGOVY) 0.25 MG/0.5ML SOAJ Inject 0.25 mg into the skin once a week. 2 mL 0   No current facility-administered medications for this visit.     ALLERGIES: Patient has no known allergies.  Family History  Problem Relation Age of Onset   Hypertension Mother    Colon polyps Mother    Lupus Maternal Grandmother    Colon cancer Maternal Grandmother    Colon cancer Paternal Grandfather    Stomach cancer Neg Hx    Esophageal cancer Neg Hx    Breast cancer Neg Hx     Social History  Socioeconomic History   Marital status: Single    Spouse name: Not on file   Number of children: Not on file   Years of education: Not on file   Highest education level: Not on file  Occupational History   Not on file  Tobacco Use   Smoking status: Former    Types: Cigarettes    Quit date: 06/14/2008    Years since quitting: 13.4   Smokeless tobacco: Never  Vaping Use   Vaping Use: Never used  Substance and Sexual Activity   Alcohol use: Not Currently   Drug use: Not Currently   Sexual activity: Not Currently    Birth control/protection: I.U.D.    Comment: Mirena insertion summer of 2017  Other Topics Concern   Not on file  Social History Narrative   Not on file   Social Determinants of Health   Financial Resource Strain: Not on file  Food Insecurity: Not on file  Transportation Needs: Not on file  Physical Activity: Not on file  Stress: Not on file  Social Connections: Not on file  Intimate Partner Violence: Not on file    Review of Systems  All other systems reviewed and are  negative.   PHYSICAL EXAMINATION:    Ht 6' (1.829 m)   Wt 283 lb (128.4 kg)   BMI 38.38 kg/m     General appearance: alert, cooperative and appears stated age   Pelvic US Uterus 8.71 x 4.88 x 4.57 cm. No myometrial masses.  IUD in normal position in endometrial canal.  IUD strings noted just inside the external os.  EMS 3.28 mm.  No masses or thickening.  Left ovary 3.79 x 2.73 x 2.77 cm.  Right ovary 3.10 x 2.69 x 2.36 cm.  No adnexal masses.  No free fluid.  ASSESSMENT  Lost IUD threads.  Subsequent visit.  Lichen sclerosus.   PLAN  Reassurance regarding IUD position.  Mirena will expire in May, 2025.  Refill of Rx for Clobetasol ointment.  Instructed in use.  FU for annual exam and prn.   An After Visit Summary was printed and given to the patient.

## 2021-11-19 ENCOUNTER — Encounter: Payer: Self-pay | Admitting: Obstetrics and Gynecology

## 2021-11-19 ENCOUNTER — Ambulatory Visit (INDEPENDENT_AMBULATORY_CARE_PROVIDER_SITE_OTHER): Payer: Federal, State, Local not specified - PPO | Admitting: Obstetrics and Gynecology

## 2021-11-19 ENCOUNTER — Ambulatory Visit (INDEPENDENT_AMBULATORY_CARE_PROVIDER_SITE_OTHER): Payer: Federal, State, Local not specified - PPO

## 2021-11-19 VITALS — BP 110/66 | Ht 72.0 in | Wt 283.0 lb

## 2021-11-19 DIAGNOSIS — L900 Lichen sclerosus et atrophicus: Secondary | ICD-10-CM | POA: Diagnosis not present

## 2021-11-19 DIAGNOSIS — T8332XA Displacement of intrauterine contraceptive device, initial encounter: Secondary | ICD-10-CM

## 2021-11-19 DIAGNOSIS — M25561 Pain in right knee: Secondary | ICD-10-CM | POA: Diagnosis not present

## 2021-11-19 DIAGNOSIS — T8332XD Displacement of intrauterine contraceptive device, subsequent encounter: Secondary | ICD-10-CM | POA: Diagnosis not present

## 2021-11-19 MED ORDER — CLOBETASOL PROPIONATE 0.05 % EX OINT: TOPICAL_OINTMENT | CUTANEOUS | 1 refills | Status: DC

## 2021-11-25 ENCOUNTER — Encounter: Payer: Self-pay | Admitting: Physician Assistant

## 2021-11-25 ENCOUNTER — Ambulatory Visit (INDEPENDENT_AMBULATORY_CARE_PROVIDER_SITE_OTHER): Payer: Federal, State, Local not specified - PPO | Admitting: Physician Assistant

## 2021-11-25 VITALS — Ht 73.0 in | Wt 284.0 lb

## 2021-11-25 DIAGNOSIS — Z6837 Body mass index (BMI) 37.0-37.9, adult: Secondary | ICD-10-CM

## 2021-11-25 MED ORDER — WEGOVY 0.5 MG/0.5ML ~~LOC~~ SOAJ: 0.5000 mg | SUBCUTANEOUS | 0 refills | Status: DC

## 2021-11-25 NOTE — Patient Instructions (Signed)

## 2021-11-25 NOTE — Progress Notes (Signed)
Telehealth office visit note for Kristina Reid, PA-C- at Primary Care at [facility]   I connected with current patient today by telephone and verified that I am speaking with the correct person    Location of the patient: Home  Location of the provider: Office - This visit type was conducted due to national recommendations for restrictions regarding the COVID-19 Pandemic (e.g. social distancing) in an effort to limit this patient's exposure and mitigate transmission in our community.    - No physical exam could be performed with this format, beyond that communicated to [place] by the patient/ family members as noted.   - Additionally my office staff/ schedulers were to discuss with the patient that there may be a monetary charge related to this service, depending on their medical insurance.  My understanding is that patient understood and consented to proceed.     _________________________________________________________________________________   History of Present Illness: Patient calls in to follow-up on weight management. Patient tolerating medication without issues. States currently having issues with her knee so has not been able to exercise, is scheduled for MRI Friday. Followed by orthopedics Percell Miller and Noemi Chapel). States her diet has been ok but trying to make better choices.           11/25/2021    1:56 PM 10/30/2021   11:10 AM 09/28/2021    3:37 PM 08/25/2021    4:07 PM  GAD 7 : Generalized Anxiety Score  Nervous, Anxious, on Edge 0 0 0 0  Control/stop worrying 0 0 0 0  Worry too much - different things 0 0 0 0  Trouble relaxing 0 0 0 1  Restless 0 0 0 0  Easily annoyed or irritable 0 0 0 0  Afraid - awful might happen 0 0 0 0  Total GAD 7 Score 0 0 0 1  Anxiety Difficulty Not difficult at all Not difficult at all Not difficult at all Not difficult at all       11/25/2021    1:55 PM 10/30/2021   11:10 AM 09/28/2021    3:37 PM 08/25/2021    4:07 PM 07/27/2021    9:11  AM  Depression screen PHQ 2/9  Decreased Interest 0 0 0 0 0  Down, Depressed, Hopeless 0 0 0 0 0  PHQ - 2 Score 0 0 0 0 0  Altered sleeping 0 0 0 0 1  Tired, decreased energy 0 1 0 0 1  Change in appetite 0 '1 1 1 1  '$ Feeling bad or failure about yourself  0 0 0 0 1  Trouble concentrating 0 0 0 0 0  Moving slowly or fidgety/restless 0 0 0 0 0  Suicidal thoughts 0 0 0 0 0  PHQ-9 Score 0 '2 1 1 4  '$ Difficult doing work/chores Not difficult at all Not difficult at all Not difficult at all Not difficult at all Not difficult at all      Impression and Recommendations:     1. Class 2 severe obesity with serious comorbidity and body mass index (BMI) of 37.0 to 37.9 in adult, unspecified obesity type [facility])   -Patient tolerating Wegovy so will titrate to 0.5 mg once a week. Recommend to continue to work on diet changes and portion control. Physical activity currently limited due to acute knee pain/swelling. Will reassess weight and medication therapy in 4 weeks.     - As part of my medical decision making, I reviewed the following data  within the [place] History obtained from pt /family, CMA notes reviewed and incorporated if applicable, Labs reviewed, Radiograph/ tests reviewed if applicable and OV notes from prior OV's with me, as well as any other specialists she/he has seen since seeing me last, were all reviewed and used in my medical decision making process today.    - Additionally, when appropriate, discussion had with patient regarding our treatment plan, and their biases/concerns about that plan were used in my medical decision making today.    - The patient agreed with the plan and demonstrated an understanding of the instructions.   No barriers to understanding were identified.     - The patient was advised to call back or seek an in-person evaluation if the symptoms worsen or if the condition fails to improve as anticipated.   Return in about 4 weeks (around  12/23/2021) for Weight check.    No orders of the defined types were placed in this encounter.   Meds ordered this encounter  Medications   Semaglutide-Weight Management (WEGOVY) 0.5 MG/0.5ML SOAJ    Sig: Inject 0.5 mg into the skin once a week.    Dispense:  2 mL    Refill:  0    Order Specific Question:   Supervising Provider    Answer:   Beatrice Lecher D [[id number]]    Medications Discontinued During This Encounter  Medication Reason   Semaglutide-Weight Management (WEGOVY) 0.25 MG/0.5ML SOAJ Dose change       Time spent on telephone encounter was 5 minutes.      The [place] was signed into law in 2016 which includes the topic of electronic health records.  This provides immediate access to information in MyChart.  This includes consultation notes, operative notes, office notes, lab results and pathology reports.  If you have any questions about what you read please let us know at your next visit or call us at the office.  We are right here with you.   __________________________________________________________________________________     Patient Care Team    Relationship Specialty Notifications Start End  Kristina Chambers, [place] PCP - General Physician Assistant  07/21/21   Nunzio Cobbs, MD Consulting Physician Obstetrics and Gynecology  01/23/20      -Vitals obtained; medications/ allergies reconciled;  personal medical, social, Sx etc.histories were updated by CMA, reviewed by me and are reflected in chart   Patient Active Problem List   Diagnosis Date Noted   Class 2 severe obesity with serious comorbidity and body mass index (BMI) of 37.0 to 37.9 in adult [facility]) 09/28/2021   Hyperlipidemia 01/15/2020   Class 2 obesity with body mass index (BMI) of 36.0 to 36.9 in adult 01/15/2020   Alcohol abuse 03/12/2019   MDD (major depressive disorder) 02/21/2019   GAD (generalized anxiety disorder) 02/21/2019     Current Meds  Medication Sig    ARIPiprazole (ABILIFY) 15 MG tablet Take one tablet daily.   buPROPion (WELLBUTRIN XL) 300 MG 24 hr tablet TAKE 1 TABLET (300 MG) EVERY MORNING.   clobetasol ointment (TEMOVATE) 0.05 % Apply to area in a thin layer bid for 2 weeks as needed.  May apply once daily at night time, twice a week for maintenance dose.   escitalopram (LEXAPRO) 10 MG tablet Take 1 tablet (10 mg total) by mouth daily.   lamoTRIgine (LAMICTAL) 150 MG tablet Take 1 tablet (150 mg total) by mouth daily.   levonorgestrel (MIRENA) 20 MCG/24HR IUD  by Intrauterine route.   lovastatin (MEVACOR) 20 MG tablet Take 1 tablet (20 mg total) by mouth at bedtime.   Semaglutide-Weight Management (WEGOVY) 0.5 MG/0.5ML SOAJ Inject 0.5 mg into the skin once a week.   [DISCONTINUED] Semaglutide-Weight Management (WEGOVY) 0.25 MG/0.5ML SOAJ Inject 0.25 mg into the skin once a week.     Allergies:  No Known Allergies   ROS:  See above HPI for pertinent positives and negatives   Objective:   Height '6\' 1"'$  (1.854 m), weight 284 lb (128.8 kg).  (if some vitals are omitted, this means that patient was UNABLE to obtain them. ) General: A & O * 3; sounds in no acute distress; in usual state of health.  Respiratory: speaking in full sentences, no conversational dyspnea  Psych: insight appears good, mood- appears full

## 2021-11-27 DIAGNOSIS — M25561 Pain in right knee: Secondary | ICD-10-CM | POA: Diagnosis not present

## 2021-12-02 ENCOUNTER — Other Ambulatory Visit: Payer: Self-pay | Admitting: Adult Health

## 2021-12-02 DIAGNOSIS — F411 Generalized anxiety disorder: Secondary | ICD-10-CM

## 2021-12-02 DIAGNOSIS — F331 Major depressive disorder, recurrent, moderate: Secondary | ICD-10-CM

## 2021-12-02 NOTE — Telephone Encounter (Signed)
Please call to F/U with patient. Past due for F/U.

## 2021-12-03 DIAGNOSIS — M25561 Pain in right knee: Secondary | ICD-10-CM | POA: Diagnosis not present

## 2021-12-03 NOTE — Telephone Encounter (Signed)
Lvm for pt to call back and schedule.  

## 2021-12-09 ENCOUNTER — Other Ambulatory Visit: Payer: Self-pay | Admitting: Adult Health

## 2021-12-11 ENCOUNTER — Telehealth: Payer: Self-pay | Admitting: Physician Assistant

## 2021-12-11 NOTE — Telephone Encounter (Signed)
Patient called back and stated she has called 11 different pharmacies and none of which has the 0.5 mg of the [facility].

## 2021-12-11 NOTE — Telephone Encounter (Signed)
I would suggest patient call around to several different pharmacies to see if she is able to locate the [facility]. If she is, we can send the RX to whichever location she is able to find the medication. AS, CMA

## 2021-12-11 NOTE — Telephone Encounter (Signed)
Per Herb Grays, unfortunately there is not an alternative at this time that's appropriate for patient to use for weight loss. AS, CMA

## 2021-12-11 NOTE — Telephone Encounter (Signed)
Patient has not been able to get her Wegovy 0.5 due to the shortage, she is asking can she go up in dosing, not take anything at all or be changed to something different? Please advise.

## 2021-12-11 NOTE — Telephone Encounter (Signed)
Patient aware.

## 2021-12-14 ENCOUNTER — Telehealth: Payer: Self-pay | Admitting: Adult Health

## 2021-12-14 ENCOUNTER — Other Ambulatory Visit: Payer: Self-pay

## 2021-12-14 DIAGNOSIS — F411 Generalized anxiety disorder: Secondary | ICD-10-CM

## 2021-12-14 DIAGNOSIS — F331 Major depressive disorder, recurrent, moderate: Secondary | ICD-10-CM

## 2021-12-14 MED ORDER — ESCITALOPRAM OXALATE 10 MG PO TABS: 10.0000 mg | ORAL_TABLET | Freq: Every day | ORAL | 0 refills | Status: DC

## 2021-12-14 MED ORDER — ARIPIPRAZOLE 15 MG PO TABS: ORAL_TABLET | ORAL | 0 refills | Status: DC

## 2021-12-14 NOTE — Telephone Encounter (Signed)
Rx sent 

## 2021-12-14 NOTE — Telephone Encounter (Signed)
Patient states that she needs refill on Abilify '15mg'$  and Lexapro '10mg'$ . Has only a couple left but not enough to make til next appt. She would like a seven day refill to get her through until next appt 7/11. Ph: [place]

## 2021-12-22 ENCOUNTER — Ambulatory Visit (INDEPENDENT_AMBULATORY_CARE_PROVIDER_SITE_OTHER): Payer: Self-pay | Admitting: Adult Health

## 2021-12-22 DIAGNOSIS — Z91199 Patient's noncompliance with other medical treatment and regimen due to unspecified reason: Secondary | ICD-10-CM

## 2021-12-22 NOTE — Progress Notes (Signed)
Patient no show appointment. ? ?

## 2022-01-01 ENCOUNTER — Encounter: Payer: Self-pay | Admitting: Adult Health

## 2022-01-01 ENCOUNTER — Ambulatory Visit: Payer: Federal, State, Local not specified - PPO | Admitting: Adult Health

## 2022-01-01 DIAGNOSIS — F331 Major depressive disorder, recurrent, moderate: Secondary | ICD-10-CM | POA: Diagnosis not present

## 2022-01-01 DIAGNOSIS — F411 Generalized anxiety disorder: Secondary | ICD-10-CM | POA: Diagnosis not present

## 2022-01-01 MED ORDER — ARIPIPRAZOLE 15 MG PO TABS: ORAL_TABLET | ORAL | 3 refills | Status: DC

## 2022-01-01 MED ORDER — ESCITALOPRAM OXALATE 10 MG PO TABS: 10.0000 mg | ORAL_TABLET | Freq: Every day | ORAL | 3 refills | Status: DC

## 2022-01-01 MED ORDER — LAMOTRIGINE 150 MG PO TABS: 150.0000 mg | ORAL_TABLET | Freq: Every day | ORAL | 3 refills | Status: DC

## 2022-01-01 MED ORDER — BUPROPION HCL ER (XL) 150 MG PO TB24
150.0000 mg | ORAL_TABLET | Freq: Every day | ORAL | 3 refills | Status: DC
Start: 2022-01-01 — End: 2022-10-01

## 2022-01-01 NOTE — Progress Notes (Signed)
Kristina Chambers [id number] 1976-02-02 46 y.o.  Subjective:   Patient ID:  Kristina Chambers is a 46 y.o. (DOB [birth year]) female.  Chief Complaint: No chief complaint on file.   HPI HARLOW BASLEY presents to the office today for follow-up of MDD, GAD, and insomnia.  Describes mood today as "so-so". Pleasant. Mood symptoms - reports some depression. Denies anxiety and irritability. Stating "I do feel better". Addition of Lexapro has helped, but still lacking the interest and motivation to do things. Willing to consider increase in Wellbutrin. Taking medications as prescribed.  Energy levels lower. Active, does not have a regular exercise routine. Walking some days. Enjoys some usual interests and activities. Single. Lives with 81 year old son.  Appetite adequate. Weight loss - 10 pounds - 260 from 270 pounds. Sleeps wells most nights. Averages 7 to 8 hours - sleeping more lately.  Focus and concentration "so-so". Completing tasks. Managing some aspects of household. 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 Denies SI or HI.  Denies AH or VH.  Previous medications: Wellbutrin, Cymbalta, Lexapro   GAD-7    Flowsheet Row Office Visit from 11/25/2021 in [place] at [facility] Visit from 10/30/2021 in [place] at [facility] Visit from 09/28/2021 in [place] at [facility] Visit from 08/25/2021 in [place] at [facility]  Total GAD-7 Score 0 0 0 [place] Visit from 11/25/2021 in [place] at [facility] Visit from 10/30/2021 in [place] at [facility] Visit from 09/28/2021 in [place] at [facility] Visit from 08/25/2021 in [place] at [facility] Visit from 07/27/2021 in [place] at [facility]  PHQ-2 Total Score 0 0 0 0 0  PHQ-9 Total Score 0 '2 1 1 4      '$ Flowsheet  Row ED from 06/01/2021 in [facility] at [place] No Risk        Review of Systems:  Review of Systems  Musculoskeletal:  Negative for gait problem.  Neurological:  Negative for tremors.  Psychiatric/Behavioral:         Please refer to HPI    Medications: I have reviewed the patient's current medications.  Current Outpatient Medications  Medication Sig Dispense Refill   buPROPion (WELLBUTRIN XL) 150 MG 24 hr tablet Take 1 tablet (150 mg total) by mouth daily. 90 tablet 3   ARIPiprazole (ABILIFY) 15 MG tablet Take one tablet daily. 90 tablet 3   buPROPion (WELLBUTRIN XL) 300 MG 24 hr tablet TAKE 1 TABLET BY MOUTH EVERY MORNING 90 tablet 3   clobetasol ointment (TEMOVATE) 0.05 % Apply to area in a thin layer bid for 2 weeks as needed.  May apply once daily at night time, twice a week for maintenance dose. 30 g 1   escitalopram (LEXAPRO) 10 MG tablet Take 1 tablet (10 mg total) by mouth daily. 90 tablet 3   lamoTRIgine (LAMICTAL) 150 MG tablet Take 1 tablet (150 mg total) by mouth daily. 90 tablet 3   levonorgestrel (MIRENA) 20 MCG/24HR IUD by Intrauterine route.     lovastatin (MEVACOR) 20 MG tablet Take 1 tablet (20 mg total) by mouth at bedtime. 90 tablet 3   Semaglutide-Weight Management (WEGOVY) 0.5 MG/0.5ML SOAJ Inject 0.5  mg into the skin once a week. 2 mL 0   No current facility-administered medications for this visit.    Medication Side Effects: None  Allergies: No Known Allergies  Past Medical History:  Diagnosis Date   Anxiety    Chicken pox    Depression    Elevated cholesterol 2020   HPV in female    Lichen sclerosus 5809   Severe dysplasia of cervix (CIN III) 2021   Substance abuse ([place])     Past Medical History, Surgical history, Social history, and Family history were reviewed and updated as appropriate.   Please see review of systems for further details on the patient's review from today.   Objective:    Physical Exam:  There were no vitals taken for this visit.  Physical Exam Constitutional:      General: She is not in acute distress. Musculoskeletal:        General: No deformity.  Neurological:     Mental Status: She is alert and oriented to person, place, and time.     Coordination: Coordination normal.  Psychiatric:        Attention and Perception: Attention and perception normal. She does not perceive auditory or visual hallucinations.        Mood and Affect: Mood normal. Mood is not anxious or depressed. Affect is not labile, blunt, angry or inappropriate.        Speech: Speech normal.        Behavior: Behavior normal.        Thought Content: Thought content normal. Thought content is not paranoid or delusional. Thought content does not include homicidal or suicidal ideation. Thought content does not include homicidal or suicidal plan.        Cognition and Memory: Cognition and memory normal.        Judgment: Judgment normal.     Comments: Insight intact     Lab Review:     Component Value Date/Time   NA 137 05/15/2021 0907   NA 138 12/26/2018 1144   K 4.1 05/15/2021 0907   CL 104 05/15/2021 0907   CO2 27 05/15/2021 0907   GLUCOSE 101 (H) 05/15/2021 0907   BUN 9 05/15/2021 0907   BUN 10 12/26/2018 1144   CREATININE 0.80 05/15/2021 0907   CREATININE 0.86 05/30/2020 1021   CALCIUM 9.3 05/15/2021 0907   PROT 7.3 05/15/2021 0907   PROT 6.9 12/26/2018 1144   ALBUMIN 4.1 05/15/2021 0907   ALBUMIN 4.4 12/26/2018 1144   AST 18 05/15/2021 0907   ALT 35 05/15/2021 0907   ALKPHOS 78 05/15/2021 0907   BILITOT 0.5 05/15/2021 0907   BILITOT 1.0 12/26/2018 1144   GFRNONAA 82 05/30/2020 1021   GFRAA 95 05/30/2020 1021       Component Value Date/Time   WBC 8.2 12/26/2018 1144   RBC 4.44 12/26/2018 1144   HGB 13.0 12/26/2018 1144   HCT 38.6 12/26/2018 1144   PLT 337 12/26/2018 1144   MCV 87 12/26/2018 1144   MCH 29.3 12/26/2018 1144   MCHC 33.7 12/26/2018 1144    RDW 11.7 12/26/2018 1144    No results found for: "POCLITH", "LITHIUM"   No results found for: "PHENYTOIN", "PHENOBARB", "VALPROATE", "CBMZ"   .res Assessment: Plan:    Plan:    Lexapro 10 mg daily - anxiety Lamictal '150mg'$  daily - mood instability Abilify '15mg'$  daily to target depression Wellbutrin XL '300mg'$  daily - depression Add Wellbutrin XL '150mg'$  daily - depression  Uses Melatonin some  nights  Time spent with patient was 20 minutes. Greater than 50% of face to face time with patient was spent on counseling and coordination of care.    RTC 3 months  Patient advised to contact office with any questions, adverse effects, or acute worsening in signs and symptoms.  Discussed potential metabolic side effects associated with atypical antipsychotics, as well as potential risk for movement side effects. Advised pt to contact office if movement side effects occur.   Counseled patient regarding potential benefits, risks, and side effects of Lamictal to include potential risk of Stevens-Johnson syndrome. Advised patient to stop taking Lamictal and contact office immediately if rash develops and to seek urgent medical attention if rash is severe and/or spreading quickly.  Discussed potential benefits, risk, and side effects of benzodiazepines to include potential risk of tolerance and dependence, as well as possible drowsiness.  Advised patient not to drive if experiencing drowsiness and to take lowest possible effective dose to minimize risk of dependence and tolerance.  Diagnoses and all orders for this visit:  Major depressive disorder, recurrent episode, moderate (HCC) -     buPROPion (WELLBUTRIN XL) 150 MG 24 hr tablet; Take 1 tablet (150 mg total) by mouth daily. -     ARIPiprazole (ABILIFY) 15 MG tablet; Take one tablet daily. -     escitalopram (LEXAPRO) 10 MG tablet; Take 1 tablet (10 mg total) by mouth daily. -     lamoTRIgine (LAMICTAL) 150 MG tablet; Take 1 tablet (150 mg  total) by mouth daily.  Generalized anxiety disorder -     ARIPiprazole (ABILIFY) 15 MG tablet; Take one tablet daily. -     escitalopram (LEXAPRO) 10 MG tablet; Take 1 tablet (10 mg total) by mouth daily. -     lamoTRIgine (LAMICTAL) 150 MG tablet; Take 1 tablet (150 mg total) by mouth daily.     Please see After Visit Summary for patient specific instructions.  Future Appointments  Date Time Provider [place]  04/02/2022  9:00 AM Ancil Dewan, Berdie Ogren, NP [facility] None  10/18/2022  2:00 PM Nunzio Cobbs, MD [facility] None    No orders of the defined types were placed in this encounter.   -------------------------------

## 2022-01-13 ENCOUNTER — Telehealth (INDEPENDENT_AMBULATORY_CARE_PROVIDER_SITE_OTHER): Payer: Federal, State, Local not specified - PPO | Admitting: Physician Assistant

## 2022-01-13 ENCOUNTER — Encounter: Payer: Self-pay | Admitting: Physician Assistant

## 2022-01-13 VITALS — Ht 73.0 in | Wt 277.0 lb

## 2022-01-13 DIAGNOSIS — Z6837 Body mass index (BMI) 37.0-37.9, adult: Secondary | ICD-10-CM

## 2022-01-13 MED ORDER — WEGOVY 1 MG/0.5ML ~~LOC~~ SOAJ: 1.0000 mg | SUBCUTANEOUS | 0 refills | Status: DC

## 2022-01-13 NOTE — Progress Notes (Signed)
Telehealth office visit note for Kristina Reid, PA-C- at Primary Care at [facility]   I connected with current patient today by telephone and verified that I am speaking with the correct person    Location of the patient: Office  Location of the provider: Office - This visit type was conducted due to national recommendations for restrictions regarding the COVID-19 Pandemic (e.g. social distancing) in an effort to limit this patient's exposure and mitigate transmission in our community.    - No physical exam could be performed with this format, beyond that communicated to [place] by the patient/ family members as noted.   - Additionally my office staff/ schedulers were to discuss with the patient that there may be a monetary charge related to this service, depending on their medical insurance.  My understanding is that patient understood and consented to proceed.     _________________________________________________________________________________   History of Present Illness: Patient calls in to follow-up on weight management. Patient states was not able to get Wegovy 0.5 mg until about 1 month ago. States has noticed a little more of reduced appetite with 0.5 mg than 0.25 mg. Administered her fourth dose of Wegovy 0.5 mg last Friday. Denies nausea, abdominal pain, diarrhea or constipation. Continues with group fitness sessions but is having knee surgery 01/25/22 for torn meniscus (right knee).       11/25/2021    1:56 PM 10/30/2021   11:10 AM 09/28/2021    3:37 PM 08/25/2021    4:07 PM  GAD 7 : Generalized Anxiety Score  Nervous, Anxious, on Edge 0 0 0 0  Control/stop worrying 0 0 0 0  Worry too much - different things 0 0 0 0  Trouble relaxing 0 0 0 1  Restless 0 0 0 0  Easily annoyed or irritable 0 0 0 0  Afraid - awful might happen 0 0 0 0  Total GAD 7 Score 0 0 0 1  Anxiety Difficulty Not difficult at all Not difficult at all Not difficult at all Not difficult at all        01/13/2022    1:05 PM 11/25/2021    1:55 PM 10/30/2021   11:10 AM 09/28/2021    3:37 PM 08/25/2021    4:07 PM  Depression screen PHQ 2/9  Decreased Interest 0 0 0 0 0  Down, Depressed, Hopeless 0 0 0 0 0  PHQ - 2 Score 0 0 0 0 0  Altered sleeping  0 0 0 0  Tired, decreased energy  0 1 0 0  Change in appetite  0 '1 1 1  '$ Feeling bad or failure about yourself   0 0 0 0  Trouble concentrating  0 0 0 0  Moving slowly or fidgety/restless  0 0 0 0  Suicidal thoughts  0 0 0 0  PHQ-9 Score  0 '2 1 1  '$ Difficult doing work/chores Not difficult at all Not difficult at all Not difficult at all Not difficult at all Not difficult at all      Impression and Recommendations:     1. Class 2 severe obesity with serious comorbidity and body mass index (BMI) of 37.0 to 37.9 in adult, unspecified obesity type ([place])   -Associated with impaired fasting glucose and hyperlipidemia. Patient has lost a total of 8 pounds since starting medication therapy (wt 285 pounds). Will titrate Wegovy to 1 mg once a week. Recommend to continue exercise as tolerated, smaller portions and diet  changes. Follow-up in 4 weeks for weight management.     - As part of my medical decision making, I reviewed the following data within the [place] History obtained from pt /family, CMA notes reviewed and incorporated if applicable, Labs reviewed, Radiograph/ tests reviewed if applicable and OV notes from prior OV's with me, as well as any other specialists she/he has seen since seeing me last, were all reviewed and used in my medical decision making process today.    - Additionally, when appropriate, discussion had with patient regarding our treatment plan, and their biases/concerns about that plan were used in my medical decision making today.    - The patient agreed with the plan and demonstrated an understanding of the instructions.   No barriers to understanding were identified.     - The patient was advised to  call back or seek an in-person evaluation if the symptoms worsen or if the condition fails to improve as anticipated.   Return in about 4 weeks (around 02/10/2022) for Wt.    No orders of the defined types were placed in this encounter.   Meds ordered this encounter  Medications   Semaglutide-Weight Management (WEGOVY) 1 MG/0.5ML SOAJ    Sig: Inject 1 mg into the skin once a week.    Dispense:  2 mL    Refill:  0    Order Specific Question:   Supervising Provider    Answer:   Beatrice Lecher D [[id number]]    Medications Discontinued During This Encounter  Medication Reason   Semaglutide-Weight Management (WEGOVY) 0.5 MG/0.5ML SOAJ Dose change       Time spent on telephone encounter was 5 minutes.      The [place] was signed into law in 2016 which includes the topic of electronic health records.  This provides immediate access to information in MyChart.  This includes consultation notes, operative notes, office notes, lab results and pathology reports.  If you have any questions about what you read please let us know at your next visit or call us at the office.  We are right here with you.   __________________________________________________________________________________     Patient Care Team    Relationship Specialty Notifications Start End  Kristina Chambers, [place] PCP - General Physician Assistant  07/21/21   Nunzio Cobbs, MD Consulting Physician Obstetrics and Gynecology  01/23/20      -Vitals obtained; medications/ allergies reconciled;  personal medical, social, Sx etc.histories were updated by CMA, reviewed by me and are reflected in chart   Patient Active Problem List   Diagnosis Date Noted   Class 2 severe obesity with serious comorbidity and body mass index (BMI) of 37.0 to 37.9 in adult [facility]) 09/28/2021   Hyperlipidemia 01/15/2020   Class 2 obesity with body mass index (BMI) of 36.0 to 36.9 in adult 01/15/2020   Alcohol abuse  03/12/2019   MDD (major depressive disorder) 02/21/2019   GAD (generalized anxiety disorder) 02/21/2019     Current Meds  Medication Sig   ARIPiprazole (ABILIFY) 15 MG tablet Take one tablet daily.   buPROPion (WELLBUTRIN XL) 150 MG 24 hr tablet Take 1 tablet (150 mg total) by mouth daily.   buPROPion (WELLBUTRIN XL) 300 MG 24 hr tablet TAKE 1 TABLET BY MOUTH EVERY MORNING   clobetasol ointment (TEMOVATE) 0.05 % Apply to area in a thin layer bid for 2 weeks as needed.  May apply once daily at night time, twice a  week for maintenance dose.   escitalopram (LEXAPRO) 10 MG tablet Take 1 tablet (10 mg total) by mouth daily.   lamoTRIgine (LAMICTAL) 150 MG tablet Take 1 tablet (150 mg total) by mouth daily.   levonorgestrel (MIRENA) 20 MCG/24HR IUD by Intrauterine route.   lovastatin (MEVACOR) 20 MG tablet Take 1 tablet (20 mg total) by mouth at bedtime.   Semaglutide-Weight Management (WEGOVY) 1 MG/0.5ML SOAJ Inject 1 mg into the skin once a week.   [DISCONTINUED] Semaglutide-Weight Management (WEGOVY) 0.5 MG/0.5ML SOAJ Inject 0.5 mg into the skin once a week.     Allergies:  No Known Allergies   ROS:  See above HPI for pertinent positives and negatives   Objective:   Height '6\' 1"'$  (1.854 m), weight 277 lb (125.6 kg).  (if some vitals are omitted, this means that patient was UNABLE to obtain them even though they were asked to get them. ) General: A & O * 3; sounds in no acute distress Respiratory: speaking in full sentences, no conversational dyspnea Psych: insight appears good, mood- appears full

## 2022-01-13 NOTE — Patient Instructions (Signed)
Calorie Counting for Weight Loss Calories are units of energy. Your body needs a certain number of calories from food to keep going throughout the day. When you eat or drink more calories than your body needs, your body stores the extra calories mostly as fat. When you eat or drink fewer calories than your body needs, your body burns fat to get the energy it needs. Calorie counting means keeping track of how many calories you eat and drink each day. Calorie counting can be helpful if you need to lose weight. If you eat fewer calories than your body needs, you should lose weight. Ask your health care provider what a healthy weight is for you. For calorie counting to work, you will need to eat the right number of calories each day to lose a healthy amount of weight per week. A dietitian can help you figure out how many calories you need in a day and will suggest ways to reach your calorie goal. A healthy amount of weight to lose each week is usually 1-2 lb (0.5-0.9 kg). This usually means that your daily calorie intake should be reduced by 500-750 calories. Eating 1,200-1,500 calories a day can help most women lose weight. Eating 1,500-1,800 calories a day can help most men lose weight. What do I need to know about calorie counting? Work with your health care provider or dietitian to determine how many calories you should get each day. To meet your daily calorie goal, you will need to: Find out how many calories are in each food that you would like to eat. Try to do this before you eat. Decide how much of the food you plan to eat. Keep a food log. Do this by writing down what you ate and how many calories it had. To successfully lose weight, it is important to balance calorie counting with a healthy lifestyle that includes regular activity. Where do I find calorie information?  The number of calories in a food can be found on a Nutrition Facts label. If a food does not have a Nutrition Facts label, try  to look up the calories online or ask your dietitian for help. Remember that calories are listed per serving. If you choose to have more than one serving of a food, you will have to multiply the calories per serving by the number of servings you plan to eat. For example, the label on a package of bread might say that a serving size is 1 slice and that there are 90 calories in a serving. If you eat 1 slice, you will have eaten 90 calories. If you eat 2 slices, you will have eaten 180 calories. How do I keep a food log? After each time that you eat, record the following in your food log as soon as possible: What you ate. Be sure to include toppings, sauces, and other extras on the food. How much you ate. This can be measured in cups, ounces, or number of items. How many calories were in each food and drink. The total number of calories in the food you ate. Keep your food log near you, such as in a pocket-sized notebook or on an app or website on your mobile phone. Some programs will calculate calories for you and show you how many calories you have left to meet your daily goal. What are some portion-control tips? Know how many calories are in a serving. This will help you know how many servings you can have of a certain   food. Use a measuring cup to measure serving sizes. You could also try weighing out portions on a kitchen scale. With time, you will be able to estimate serving sizes for some foods. Take time to put servings of different foods on your favorite plates or in your favorite bowls and cups so you know what a serving looks like. Try not to eat straight from a food's packaging, such as from a bag or box. Eating straight from the package makes it hard to see how much you are eating and can lead to overeating. Put the amount you would like to eat in a cup or on a plate to make sure you are eating the right portion. Use smaller plates, glasses, and bowls for smaller portions and to prevent  overeating. Try not to multitask. For example, avoid watching TV or using your computer while eating. If it is time to eat, sit down at a table and enjoy your food. This will help you recognize when you are full. It will also help you be more mindful of what and how much you are eating. What are tips for following this plan? Reading food labels Check the calorie count compared with the serving size. The serving size may be smaller than what you are used to eating. Check the source of the calories. Try to choose foods that are high in protein, fiber, and vitamins, and low in saturated fat, trans fat, and sodium. Shopping Read nutrition labels while you shop. This will help you make healthy decisions about which foods to buy. Pay attention to nutrition labels for low-fat or fat-free foods. These foods sometimes have the same number of calories or more calories than the full-fat versions. They also often have added sugar, starch, or salt to make up for flavor that was removed with the fat. Make a grocery list of lower-calorie foods and stick to it. Cooking Try to cook your favorite foods in a healthier way. For example, try baking instead of frying. Use low-fat dairy products. Meal planning Use more fruits and vegetables. One-half of your plate should be fruits and vegetables. Include lean proteins, such as chicken, turkey, and fish. Lifestyle Each week, aim to do one of the following: 150 minutes of moderate exercise, such as walking. 75 minutes of vigorous exercise, such as running. General information Know how many calories are in the foods you eat most often. This will help you calculate calorie counts faster. Find a way of tracking calories that works for you. Get creative. Try different apps or programs if writing down calories does not work for you. What foods should I eat?  Eat nutritious foods. It is better to have a nutritious, high-calorie food, such as an avocado, than a food with  few nutrients, such as a bag of potato chips. Use your calories on foods and drinks that will fill you up and will not leave you hungry soon after eating. Examples of foods that fill you up are nuts and nut butters, vegetables, lean proteins, and high-fiber foods such as whole grains. High-fiber foods are foods with more than 5 g of fiber per serving. Pay attention to calories in drinks. Low-calorie drinks include water and unsweetened drinks. The items listed above may not be a complete list of foods and beverages you can eat. Contact a dietitian for more information. What foods should I limit? Limit foods or drinks that are not good sources of vitamins, minerals, or protein or that are high in unhealthy fats. These   include: Candy. Other sweets. Sodas, specialty coffee drinks, alcohol, and juice. The items listed above may not be a complete list of foods and beverages you should avoid. Contact a dietitian for more information. How do I count calories when eating out? Pay attention to portions. Often, portions are much larger when eating out. Try these tips to keep portions smaller: Consider sharing a meal instead of getting your own. If you get your own meal, eat only half of it. Before you start eating, ask for a container and put half of your meal into it. When available, consider ordering smaller portions from the menu instead of full portions. Pay attention to your food and drink choices. Knowing the way food is cooked and what is included with the meal can help you eat fewer calories. If calories are listed on the menu, choose the lower-calorie options. Choose dishes that include vegetables, fruits, whole grains, low-fat dairy products, and lean proteins. Choose items that are boiled, broiled, grilled, or steamed. Avoid items that are buttered, battered, fried, or served with cream sauce. Items labeled as crispy are usually fried, unless stated otherwise. Choose water, low-fat milk,  unsweetened iced tea, or other drinks without added sugar. If you want an alcoholic beverage, choose a lower-calorie option, such as a glass of wine or light beer. Ask for dressings, sauces, and syrups on the side. These are usually high in calories, so you should limit the amount you eat. If you want a salad, choose a garden salad and ask for grilled meats. Avoid extra toppings such as bacon, cheese, or fried items. Ask for the dressing on the side, or ask for olive oil and vinegar or lemon to use as dressing. Estimate how many servings of a food you are given. Knowing serving sizes will help you be aware of how much food you are eating at restaurants. Where to find more information Centers for Disease Control and Prevention: www.cdc.gov U.S. Department of Agriculture: myplate.gov Summary Calorie counting means keeping track of how many calories you eat and drink each day. If you eat fewer calories than your body needs, you should lose weight. A healthy amount of weight to lose per week is usually 1-2 lb (0.5-0.9 kg). This usually means reducing your daily calorie intake by 500-750 calories. The number of calories in a food can be found on a Nutrition Facts label. If a food does not have a Nutrition Facts label, try to look up the calories online or ask your dietitian for help. Use smaller plates, glasses, and bowls for smaller portions and to prevent overeating. Use your calories on foods and drinks that will fill you up and not leave you hungry shortly after a meal. This information is not intended to replace advice given to you by your health care provider. Make sure you discuss any questions you have with your health care provider. Document Revised: 07/12/2019 Document Reviewed: 07/12/2019 Elsevier Patient Education  2023 Elsevier Inc.  

## 2022-01-25 DIAGNOSIS — X58XXXA Exposure to other specified factors, initial encounter: Secondary | ICD-10-CM | POA: Diagnosis not present

## 2022-01-25 DIAGNOSIS — S83271A Complex tear of lateral meniscus, current injury, right knee, initial encounter: Secondary | ICD-10-CM | POA: Diagnosis not present

## 2022-01-25 DIAGNOSIS — Y999 Unspecified external cause status: Secondary | ICD-10-CM | POA: Diagnosis not present

## 2022-01-25 DIAGNOSIS — M948X6 Other specified disorders of cartilage, lower leg: Secondary | ICD-10-CM | POA: Diagnosis not present

## 2022-01-25 DIAGNOSIS — S83281A Other tear of lateral meniscus, current injury, right knee, initial encounter: Secondary | ICD-10-CM | POA: Diagnosis not present

## 2022-01-28 DIAGNOSIS — S83281D Other tear of lateral meniscus, current injury, right knee, subsequent encounter: Secondary | ICD-10-CM | POA: Diagnosis not present

## 2022-02-04 DIAGNOSIS — S83281D Other tear of lateral meniscus, current injury, right knee, subsequent encounter: Secondary | ICD-10-CM | POA: Diagnosis not present

## 2022-02-10 DIAGNOSIS — S83281D Other tear of lateral meniscus, current injury, right knee, subsequent encounter: Secondary | ICD-10-CM | POA: Diagnosis not present

## 2022-02-12 DIAGNOSIS — S83281D Other tear of lateral meniscus, current injury, right knee, subsequent encounter: Secondary | ICD-10-CM | POA: Diagnosis not present

## 2022-02-16 DIAGNOSIS — S83281D Other tear of lateral meniscus, current injury, right knee, subsequent encounter: Secondary | ICD-10-CM | POA: Diagnosis not present

## 2022-02-24 DIAGNOSIS — S83281D Other tear of lateral meniscus, current injury, right knee, subsequent encounter: Secondary | ICD-10-CM | POA: Diagnosis not present

## 2022-03-17 DIAGNOSIS — S83281D Other tear of lateral meniscus, current injury, right knee, subsequent encounter: Secondary | ICD-10-CM | POA: Diagnosis not present

## 2022-03-25 DIAGNOSIS — S83281D Other tear of lateral meniscus, current injury, right knee, subsequent encounter: Secondary | ICD-10-CM | POA: Diagnosis not present

## 2022-04-02 ENCOUNTER — Ambulatory Visit: Payer: Federal, State, Local not specified - PPO | Admitting: Adult Health

## 2022-04-02 DIAGNOSIS — S83281D Other tear of lateral meniscus, current injury, right knee, subsequent encounter: Secondary | ICD-10-CM | POA: Diagnosis not present

## 2022-04-05 ENCOUNTER — Ambulatory Visit: Payer: Federal, State, Local not specified - PPO | Admitting: Adult Health

## 2022-04-05 ENCOUNTER — Encounter: Payer: Self-pay | Admitting: Adult Health

## 2022-04-05 DIAGNOSIS — F331 Major depressive disorder, recurrent, moderate: Secondary | ICD-10-CM | POA: Diagnosis not present

## 2022-04-05 DIAGNOSIS — G4700 Insomnia, unspecified: Secondary | ICD-10-CM | POA: Diagnosis not present

## 2022-04-05 DIAGNOSIS — F411 Generalized anxiety disorder: Secondary | ICD-10-CM

## 2022-04-05 NOTE — Progress Notes (Signed)
Kristina Chambers [id number] August 01, 1975 46 y.o.  Subjective:   Patient ID:  Kristina Chambers is a 46 y.o. (DOB 02-12-1976) female.  Chief Complaint: No chief complaint on file.   HPI Kristina Chambers presents to the office today for follow-up of MDD, GAD and insomnia.  Describes mood today as "so-so". Pleasant. Denies tearfulness. Mood symptoms - denies depression, anxiety and irritability. Mood is consistent. Stating "I'm doing better". Feels like increase in Wellbutrin from '300mg'$  to '450mg'$  was helpful. Taking medications as prescribed.  Energy levels pretty good. Active, does not have a regular exercise routine. Walking some days. Enjoys some usual interests and activities. Single. Lives with son.  Appetite adequate. Weight gain - 286 pounds. Sleeps wells most nights. Averages 8 to 9 hours. Focus and concentration stable. Completing tasks. Managing some aspects of household. 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 Denies SI or HI.  Denies AH or VH.  Previous medications: Wellbutrin, Cymbalta, Lexapro   GAD-7    Flowsheet Row Office Visit from 11/25/2021 in [place] at [facility] Visit from 10/30/2021 in [place] at [facility] Visit from 09/28/2021 in [place] at [facility] Visit from 08/25/2021 in [place] at [facility]  Total GAD-7 Score 0 0 0 1      [place] Video Visit from 01/13/2022 in [place] at [facility] Visit from 11/25/2021 in  at [facility] Visit from 10/30/2021 in [place] at [facility] Visit from 09/28/2021 in [place] at [facility] Visit from 08/25/2021 in [place] at [facility]  PHQ-2 Total Score 0 0 0 0 0  PHQ-9 Total Score -- 0 '2 1 1      '$ Flowsheet Row ED from 06/01/2021 in [facility] [place] No  Risk        Review of Systems:  Review of Systems  Musculoskeletal:  Negative for gait problem.  Neurological:  Negative for tremors.  Psychiatric/Behavioral:         Please refer to HPI    Medications: I have reviewed the patient's current medications.  Current Outpatient Medications  Medication Sig Dispense Refill   ARIPiprazole (ABILIFY) 15 MG tablet Take one tablet daily. 90 tablet 3   buPROPion (WELLBUTRIN XL) 150 MG 24 hr tablet Take 1 tablet (150 mg total) by mouth daily. 90 tablet 3   buPROPion (WELLBUTRIN XL) 300 MG 24 hr tablet TAKE 1 TABLET BY MOUTH EVERY MORNING 90 tablet 3   clobetasol ointment (TEMOVATE) 0.05 % Apply to area in a thin layer bid for 2 weeks as needed.  May apply once daily at night time, twice a week for maintenance dose. 30 g 1   escitalopram (LEXAPRO) 10 MG tablet Take 1 tablet (10 mg total) by mouth daily. 90 tablet 3   lamoTRIgine (LAMICTAL) 150 MG tablet Take 1 tablet (150 mg total) by mouth daily. 90 tablet 3   levonorgestrel (MIRENA) 20 MCG/24HR IUD by Intrauterine route.     lovastatin (MEVACOR) 20 MG tablet Take 1 tablet (20 mg total) by mouth at bedtime. 90 tablet 3   Semaglutide-Weight Management (WEGOVY) 1 MG/0.5ML SOAJ Inject 1 mg into the skin once a week. 2 mL 0   No current facility-administered medications for this visit.  Medication Side Effects: None  Allergies: No Known Allergies  Past Medical History:  Diagnosis Date   Anxiety    Chicken pox    Depression    Elevated cholesterol 2020   HPV in female    Lichen sclerosus 6295   Severe dysplasia of cervix (CIN III) 2021   Substance abuse ([place])     Past Medical History, Surgical history, Social history, and Family history were reviewed and updated as appropriate.   Please see review of systems for further details on the patient's review from today.   Objective:   Physical Exam:  There were no vitals taken for this visit.  Physical Exam Constitutional:       General: She is not in acute distress. Musculoskeletal:        General: No deformity.  Neurological:     Mental Status: She is alert and oriented to person, place, and time.     Coordination: Coordination normal.  Psychiatric:        Attention and Perception: Attention and perception normal. She does not perceive auditory or visual hallucinations.        Mood and Affect: Mood normal. Mood is not anxious or depressed. Affect is not labile, blunt, angry or inappropriate.        Speech: Speech normal.        Behavior: Behavior normal.        Thought Content: Thought content normal. Thought content is not paranoid or delusional. Thought content does not include homicidal or suicidal ideation. Thought content does not include homicidal or suicidal plan.        Cognition and Memory: Cognition and memory normal.        Judgment: Judgment normal.     Comments: Insight intact     Lab Review:     Component Value Date/Time   NA 137 05/15/2021 0907   NA 138 12/26/2018 1144   K 4.1 05/15/2021 0907   CL 104 05/15/2021 0907   CO2 27 05/15/2021 0907   GLUCOSE 101 (H) 05/15/2021 0907   BUN 9 05/15/2021 0907   BUN 10 12/26/2018 1144   CREATININE 0.80 05/15/2021 0907   CREATININE 0.86 05/30/2020 1021   CALCIUM 9.3 05/15/2021 0907   PROT 7.3 05/15/2021 0907   PROT 6.9 12/26/2018 1144   ALBUMIN 4.1 05/15/2021 0907   ALBUMIN 4.4 12/26/2018 1144   AST 18 05/15/2021 0907   ALT 35 05/15/2021 0907   ALKPHOS 78 05/15/2021 0907   BILITOT 0.5 05/15/2021 0907   BILITOT 1.0 12/26/2018 1144   GFRNONAA 82 05/30/2020 1021   GFRAA 95 05/30/2020 1021       Component Value Date/Time   WBC 8.2 12/26/2018 1144   RBC 4.44 12/26/2018 1144   HGB 13.0 12/26/2018 1144   HCT 38.6 12/26/2018 1144   PLT 337 12/26/2018 1144   MCV 87 12/26/2018 1144   MCH 29.3 12/26/2018 1144   MCHC 33.7 12/26/2018 1144   RDW 11.7 12/26/2018 1144    No results found for: "POCLITH", "LITHIUM"   No results found for:  "PHENYTOIN", "PHENOBARB", "VALPROATE", "CBMZ"   .res Assessment: Plan:    Plan:    Lexapro 10 mg daily - anxiety Lamictal '150mg'$  daily - mood instability Abilify '15mg'$  daily to target depression Wellbutrin XL '300mg'$  daily - depression Wellbutrin XL '150mg'$  daily - depression  Uses Melatonin some nights  RTC 6 months  Patient advised to contact office with any questions, adverse effects, or acute worsening in signs and symptoms.  Discussed potential metabolic side effects associated with atypical antipsychotics, as well as potential risk for movement side effects. Advised pt to contact office if movement side effects occur.   Counseled patient regarding potential benefits, risks, and side effects of Lamictal to include potential risk of Stevens-Johnson syndrome. Advised patient to stop taking Lamictal and contact office immediately if rash develops and to seek urgent medical attention if rash is severe and/or spreading quickly.  Discussed potential benefits, risk, and side effects of benzodiazepines to include potential risk of tolerance and dependence, as well as possible drowsiness.  Advised patient not to drive if experiencing drowsiness and to take lowest possible effective dose to minimize risk of dependence and tolerance.  Diagnoses and all orders for this visit:  Major depressive disorder, recurrent episode, moderate (HCC)  Generalized anxiety disorder  Insomnia, unspecified type     Please see After Visit Summary for patient specific instructions.  Future Appointments  Date Time Provider [place]  10/01/2022  8:20 AM Ruby Dilone, Berdie Ogren, NP [facility] None  10/18/2022  2:00 PM Nunzio Cobbs, MD [facility] None    No orders of the defined types were placed in this encounter.   -------------------------------

## 2022-04-09 DIAGNOSIS — S83281D Other tear of lateral meniscus, current injury, right knee, subsequent encounter: Secondary | ICD-10-CM | POA: Diagnosis not present

## 2022-04-13 ENCOUNTER — Ambulatory Visit (INDEPENDENT_AMBULATORY_CARE_PROVIDER_SITE_OTHER): Payer: Federal, State, Local not specified - PPO | Admitting: Physician Assistant

## 2022-04-13 ENCOUNTER — Encounter: Payer: Self-pay | Admitting: Physician Assistant

## 2022-04-13 VITALS — Wt 286.0 lb

## 2022-04-13 DIAGNOSIS — Z6837 Body mass index (BMI) 37.0-37.9, adult: Secondary | ICD-10-CM | POA: Diagnosis not present

## 2022-04-13 MED ORDER — SEMAGLUTIDE-WEIGHT MANAGEMENT 1.7 MG/0.75ML ~~LOC~~ SOAJ
1.7000 mg | SUBCUTANEOUS | 0 refills | Status: DC
Start: 2022-04-13 — End: 2022-08-20

## 2022-04-13 NOTE — Progress Notes (Signed)
Telehealth office visit note for Kristina Reid, PA-C- at Primary Care at [facility]   I connected with current patient today by telephone and verified that I am speaking with the correct person    Location of the patient: Home  Location of the provider: home office - This visit type was conducted due to national recommendations for restrictions regarding the COVID-19 Pandemic (e.g. social distancing) in an effort to limit this patient's exposure and mitigate transmission in our community.    - No physical exam could be performed with this format, beyond that communicated to [place] by the patient/ family members as noted.   - Additionally my office staff/ schedulers were to discuss with the patient that there may be a monetary charge related to this service, depending on their medical insurance.  My understanding is that patient understood and consented to proceed.     _________________________________________________________________________________   History of Present Illness: Patient calls in for weight management. Patient reports after her knee surgery she gained some weight due to being more sedentary and eating more. But since being able to resume her exercise of working out 3 times per week and also is walking her dogs has been able to lose several pounds. Reports completed the last dose of Wegovy 1 mg last week. Did notice feeling sooner with the 1 mg and eating smaller portions. Tolerating medication without issues. No abdominal pain, nausea, vomiting or altered bowel habits.       11/25/2021    1:56 PM 10/30/2021   11:10 AM 09/28/2021    3:37 PM 08/25/2021    4:07 PM  GAD 7 : Generalized Anxiety Score  Nervous, Anxious, on Edge 0 0 0 0  Control/stop worrying 0 0 0 0  Worry too much - different things 0 0 0 0  Trouble relaxing 0 0 0 1  Restless 0 0 0 0  Easily annoyed or irritable 0 0 0 0  Afraid - awful might happen 0 0 0 0  Total GAD 7 Score 0 0 0 1  Anxiety Difficulty  Not difficult at all Not difficult at all Not difficult at all Not difficult at all       01/13/2022    1:05 PM 11/25/2021    1:55 PM 10/30/2021   11:10 AM 09/28/2021    3:37 PM 08/25/2021    4:07 PM  Depression screen PHQ 2/9  Decreased Interest 0 0 0 0 0  Down, Depressed, Hopeless 0 0 0 0 0  PHQ - 2 Score 0 0 0 0 0  Altered sleeping  0 0 0 0  Tired, decreased energy  0 1 0 0  Change in appetite  0 '1 1 1  '$ Feeling bad or failure about yourself   0 0 0 0  Trouble concentrating  0 0 0 0  Moving slowly or fidgety/restless  0 0 0 0  Suicidal thoughts  0 0 0 0  PHQ-9 Score  0 '2 1 1  '$ Difficult doing work/chores Not difficult at all Not difficult at all Not difficult at all Not difficult at all Not difficult at all      Impression and Recommendations:     1. Class 2 severe obesity with serious comorbidity and body mass index (BMI) of 37.0 to 37.9 in adult, unspecified obesity type (HCC)     Class 2 severe obesity with serious comorbidity and body mass index (BMI) of 37.0 to 37.9 in adult, unspecified obesity type: -  Patient has gained weight from last visit secondary to post-surgery recovery. Will increase Wegovy to 1.7 mg once a week and recommend to continue with incorporating physical activity and diet changes including portion control. Recommend to follow-up in 4 weeks to reassess weight and medication therapy.   - As part of my medical decision making, I reviewed the following data within the [place] History obtained from pt /family, CMA notes reviewed and incorporated if applicable, Labs reviewed, Radiograph/ tests reviewed if applicable and OV notes from prior OV's with me, as well as any other specialists she/he has seen since seeing me last, were all reviewed and used in my medical decision making process today.    - Additionally, when appropriate, discussion had with patient regarding our treatment plan, and their biases/concerns about that plan were used in my  medical decision making today.    - The patient agreed with the plan and demonstrated an understanding of the instructions.   No barriers to understanding were identified.     - The patient was advised to call back or seek an in-person evaluation if the symptoms worsen or if the condition fails to improve as anticipated.   Return in about 4 weeks (around 05/11/2022) for Wt.    No orders of the defined types were placed in this encounter.   Meds ordered this encounter  Medications   Semaglutide-Weight Management 1.7 MG/0.75ML SOAJ    Sig: Inject 1.7 mg into the skin once a week.    Dispense:  3 mL    Refill:  0    Order Specific Question:   Supervising Provider    Answer:   Beatrice Lecher D [[id number]]    Medications Discontinued During This Encounter  Medication Reason   Semaglutide-Weight Management (WEGOVY) 1 MG/0.5ML SOAJ Dose change       Time spent on telephone encounter was 6 minutes.      The [place] was signed into law in 2016 which includes the topic of electronic health records.  This provides immediate access to information in MyChart.  This includes consultation notes, operative notes, office notes, lab results and pathology reports.  If you have any questions about what you read please let us know at your next visit or call us at the office.  We are right here with you.   __________________________________________________________________________________     Patient Care Team    Relationship Specialty Notifications Start End  Kristina Chambers, [place] PCP - General Physician Assistant  07/21/21   Nunzio Cobbs, MD Consulting Physician Obstetrics and Gynecology  01/23/20      -Vitals obtained; medications/ allergies reconciled;  personal medical, social, Sx etc.histories were updated by CMA, reviewed by me and are reflected in chart   Patient Active Problem List   Diagnosis Date Noted   Class 2 severe obesity with serious  comorbidity and body mass index (BMI) of 37.0 to 37.9 in adult [facility]) 09/28/2021   Hyperlipidemia 01/15/2020   Class 2 obesity with body mass index (BMI) of 36.0 to 36.9 in adult 01/15/2020   Alcohol abuse 03/12/2019   MDD (major depressive disorder) 02/21/2019   GAD (generalized anxiety disorder) 02/21/2019     Current Meds  Medication Sig   Semaglutide-Weight Management 1.7 MG/0.75ML SOAJ Inject 1.7 mg into the skin once a week.     Allergies:  No Known Allergies   ROS:  See above HPI for pertinent positives and negatives   Objective:  Weight 286 lb (129.7 kg).  (if some vitals are omitted, this means that patient was UNABLE to obtain them. ) General: A & O * 3; sounds in no acute distress Respiratory: speaking in full sentences, no conversational dyspnea Psych: insight appears good, mood- appears full

## 2022-04-29 DIAGNOSIS — S83281D Other tear of lateral meniscus, current injury, right knee, subsequent encounter: Secondary | ICD-10-CM | POA: Diagnosis not present

## 2022-04-30 DIAGNOSIS — S83281D Other tear of lateral meniscus, current injury, right knee, subsequent encounter: Secondary | ICD-10-CM | POA: Diagnosis not present

## 2022-06-01 DIAGNOSIS — S83281D Other tear of lateral meniscus, current injury, right knee, subsequent encounter: Secondary | ICD-10-CM | POA: Diagnosis not present

## 2022-07-19 ENCOUNTER — Other Ambulatory Visit: Payer: Self-pay | Admitting: Obstetrics and Gynecology

## 2022-07-19 DIAGNOSIS — Z1231 Encounter for screening mammogram for malignant neoplasm of breast: Secondary | ICD-10-CM

## 2022-07-23 ENCOUNTER — Ambulatory Visit
Admission: RE | Admit: 2022-07-23 | Discharge: 2022-07-23 | Disposition: A | Discharge status: Home / Self Care | Payer: Federal, State, Local not specified - PPO | Source: Ambulatory Visit | Attending: Obstetrics and Gynecology | Admitting: Obstetrics and Gynecology

## 2022-07-23 DIAGNOSIS — Z1231 Encounter for screening mammogram for malignant neoplasm of breast: Secondary | ICD-10-CM | POA: Diagnosis not present

## 2022-07-28 ENCOUNTER — Other Ambulatory Visit: Payer: Self-pay | Admitting: Family Medicine

## 2022-08-20 ENCOUNTER — Encounter: Payer: Self-pay | Admitting: Family Medicine

## 2022-08-20 ENCOUNTER — Ambulatory Visit: Payer: Federal, State, Local not specified - PPO | Admitting: Family Medicine

## 2022-08-20 VITALS — BP 136/80 | HR 82 | Temp 97.6°F | Ht 73.0 in | Wt 297.0 lb

## 2022-08-20 DIAGNOSIS — E669 Obesity, unspecified: Secondary | ICD-10-CM

## 2022-08-20 DIAGNOSIS — E785 Hyperlipidemia, unspecified: Secondary | ICD-10-CM | POA: Diagnosis not present

## 2022-08-20 DIAGNOSIS — Z7689 Persons encountering health services in other specified circumstances: Secondary | ICD-10-CM | POA: Diagnosis not present

## 2022-08-20 DIAGNOSIS — F1011 Alcohol abuse, in remission: Secondary | ICD-10-CM | POA: Insufficient documentation

## 2022-08-20 MED ORDER — ZEPBOUND 2.5 MG/0.5ML ~~LOC~~ SOAJ: 2.5000 mg | SUBCUTANEOUS | 0 refills | Status: DC

## 2022-08-20 MED ORDER — LOVASTATIN 20 MG PO TABS: 20.0000 mg | ORAL_TABLET | Freq: Every day | ORAL | 0 refills | Status: DC

## 2022-08-20 NOTE — Patient Instructions (Signed)
Tirzepatide Injection (Weight Management) What is this medication? TIRZEPATIDE (tir ZEP a tide) promotes weight loss. It may also be used to maintain weight loss. It works by decreasing appetite. Changes to diet and exercise are often combined with this medication. This medicine may be used for other purposes; ask your health care provider or pharmacist if you have questions. COMMON BRAND NAME(S): Zepbound What should I tell my care team before I take this medication? They need to know if you have any of these conditions: Eye disease caused by diabetes Gallbladder disease History of depression Pancreatic disease Kidney disease Stomach or intestine problems, such as problems digesting food Suicidal thoughts, plans, or attempt by you or a family member Personal or family history of MEN 2, a condition that causes endocrine gland tumors Personal or family history of thyroid cancer An unusual or allergic reaction to tirzepatide, other medications, foods, dyes, or preservatives Pregnant or trying to get pregnant Breastfeeding How should I use this medication? This medication is injected under the skin. You will be taught how to prepare and give it. Take it as directed on the prescription label. Keep taking it unless your care team tells you to stop. It is important that you put your used needles and syringes in a special sharps container. Do not put them in a trash can. If you do not have a sharps container, call your pharmacist or care team to get one. A special MedGuide will be given to you by the pharmacist with each prescription and refill. Be sure to read this information carefully each time. This medication comes with INSTRUCTIONS FOR USE. Ask your pharmacist for directions on how to use this medication. Read the information carefully. Talk to your pharmacist or care team if you have questions. Talk to your care team about the use of this medication in children. Special care may be  needed. Overdosage: If you think you have taken too much of this medicine contact a poison control center or emergency room at once. NOTE: This medicine is only for you. Do not share this medicine with others. What if I miss a dose? If you miss a dose, take it as soon as you can unless it is more than 4 days (96 hours) late. If it is more than 4 days late, skip the missed dose. Take the next dose at the normal time. Do not take 2 doses within 3 days (72 hours) of each other. What may interact with this medication? Certain medications for diabetes, such as insulin, glyburide, glipizide This medication may affect how other medications work. Talk with your care team about all of the medications you take. They may suggest changes to your treatment plan to lower the risk of side effects and to make sure your medications work as intended. This list may not describe all possible interactions. Give your health care provider a list of all the medicines, herbs, non-prescription drugs, or dietary supplements you use. Also tell them if you smoke, drink alcohol, or use illegal drugs. Some items may interact with your medicine. What should I watch for while using this medication? Visit your care team for regular checks on your progress. It may be some time before you see the benefit from this medication. Check with your care team if you have severe diarrhea, nausea, and vomiting, or if you sweat a lot. The loss of too much body fluid may make it dangerous for you to take this medication. Tell your care team if you are taking   medications to treat diabetes, such as insulin or sulfonylureas. This may increase your risk of low blood sugar. Know the symptoms of low blood sugar and how to treat it. Talk to your care team about your risk of cancer. You may be more at risk for certain types of cancer if you take this medication. Estrogen and progestin hormones may not work as well while you are taking this medication. If  you take these as pills by mouth, your care team may recommend another type of contraception for 4 weeks after you start this medication and for 4 weeks after each dose increase. Talk to your care team about contraceptive options. They can help you find the option that works for you. What side effects may I notice from receiving this medication? Side effects that you should report to your care team as soon as possible: Allergic reactions or angioedema--skin rash, itching or hives, swelling of the face, eyes, lips, tongue, arms, or legs, trouble swallowing or breathing Bowel blockage--stomach cramping, unable to have a bowel movement or pass gas, loss of appetite, vomiting Change in vision Dehydration--increased thirst, dry mouth, feeling faint or lightheaded, headache, dark yellow or brown urine Gallbladder problems--severe stomach pain, nausea, vomiting, fever Kidney injury--decrease in the amount of urine, swelling of the ankles, hands, or feet Pancreatitis--severe stomach pain that spreads to your back or gets worse after eating or when touched, fever, nausea, vomiting Thoughts of suicide or self-harm, worsening mood, feelings of depression Thyroid cancer--new mass or lump in the neck, pain or trouble swallowing, trouble breathing, hoarseness Side effects that usually do not require medical attention (report these to your care team if they continue or are bothersome): Constipation Diarrhea Nausea Pain, redness, or irritation at injection site Stomach pain Upset stomach Vomiting This list may not describe all possible side effects. Call your doctor for medical advice about side effects. You may report side effects to FDA at 1-800-FDA-1088. Where should I keep my medication? Keep out of the reach of children and pets. Store in a refrigerator or at room temperature up to 30 degrees C (86 degrees F). Keep it in the original container. Protect from light. Refrigeration (preferred): Store in the  refrigerator. Do not freeze. Get rid of any unused medication after the expiration date. Room temperature: This medication may be stored at room temperature for up to 21 days. If it is stored at room temperature, get rid of any unused medication after 21 days or after it expires, whichever is first. To get rid of medications that are no longer needed or have expired: Take the medication to a medication take-back program. Check with your pharmacy or law enforcement to find a location. If you cannot return the medication, ask your pharmacist or care team how to get rid of this medication safely. NOTE: This sheet is a summary. It may not cover all possible information. If you have questions about this medicine, talk to your doctor, pharmacist, or health care provider.  2023 Elsevier/Gold Standard (2022-04-26 00:00:00)  

## 2022-08-20 NOTE — Progress Notes (Unsigned)
New Patient Office Visit  Subjective    Patient ID: SUNDAY WANLASS, female    DOB: [birth year]  Age: 47 y.o. MRN: [id number]  CC:  Chief Complaint  Patient presents with   Establish Care    Would like to discuss weight works out [employer] eats a lot at night, needs cholesterol medication    HPI ZERIYAH PLETT presents to establish care Previous medical care- [facility]  Other providers- OB/GYN- Dr. Tammy Sours Mozingo - [facility]  HLD-   Obesity- highest weight ever.   States she took Ozempic 2 years ago and lost 30 lbs. She had a lot of vomiting.  She went back on Ozempic and it has no effect Wegovy also had no effect.    DEA-  Single, 1 kid    Denies fever, chills, dizziness, chest pain, palpitations, shortness of breath, abdominal pain, N/V/D, urinary symptoms, LE edema.       Outpatient Encounter Medications as of 08/20/2022  Medication Sig   ARIPiprazole (ABILIFY) 15 MG tablet Take one tablet daily.   buPROPion (WELLBUTRIN XL) 150 MG 24 hr tablet Take 1 tablet (150 mg total) by mouth daily.   buPROPion (WELLBUTRIN XL) 300 MG 24 hr tablet TAKE 1 TABLET BY MOUTH EVERY MORNING   clobetasol ointment (TEMOVATE) 0.05 % Apply to area in a thin layer bid for 2 weeks as needed.  May apply once daily at night time, twice a week for maintenance dose.   escitalopram (LEXAPRO) 10 MG tablet Take 1 tablet (10 mg total) by mouth daily.   lamoTRIgine (LAMICTAL) 150 MG tablet Take 1 tablet (150 mg total) by mouth daily.   levonorgestrel (MIRENA) 20 MCG/24HR IUD by Intrauterine route.   tirzepatide (ZEPBOUND) 2.5 MG/0.5ML Pen Inject 2.5 mg into the skin once a week.   [DISCONTINUED] lovastatin (MEVACOR) 20 MG tablet Take 1 tablet (20 mg total) by mouth at bedtime.   lovastatin (MEVACOR) 20 MG tablet Take 1 tablet (20 mg total) by mouth at bedtime.   [DISCONTINUED] Semaglutide-Weight Management 1.7 MG/0.75ML SOAJ Inject 1.7 mg into the skin once a week. (Patient not taking: Reported  on 08/20/2022)   No facility-administered encounter medications on file as of 08/20/2022.    Past Medical History:  Diagnosis Date   Anxiety    Chicken pox    Depression    Elevated cholesterol 2020   GERD (gastroesophageal reflux disease) 2023   HPV in female    Lichen sclerosus [id number]   Severe dysplasia of cervix (CIN III) 2021   Substance abuse ([place])     Past Surgical History:  Procedure Laterality Date   CLAVICLE SURGERY     FRACTURE SURGERY  2010   GYNECOLOGIC CRYOSURGERY     LEEP  03/11/2020   CIN II, margins positive for CIN I    Family History  Problem Relation Age of Onset   Hypertension Mother    Colon polyps Mother    Depression Mother    Lupus Maternal Grandmother    Colon cancer Maternal Grandmother    Colon cancer Paternal Grandfather    Stomach cancer Neg Hx    Esophageal cancer Neg Hx    Breast cancer Neg Hx     Social History   Socioeconomic History   Marital status: Single    Spouse name: Not on file   Number of children: Not on file   Years of education: Not on file   Highest education level: Not on file  Occupational History  Not on file  Tobacco Use   Smoking status: Former    Types: Cigarettes    Quit date: 06/14/2008    Years since quitting: 14.1   Smokeless tobacco: Never  Vaping Use   Vaping Use: Never used  Substance and Sexual Activity   Alcohol use: Not Currently   Drug use: Never   Sexual activity: Not Currently    Birth control/protection: I.U.D.    Comment: Mirena insertion summer of 2017  Other Topics Concern   Not on file  Social History Narrative   Not on file   Social Determinants of Health   Financial Resource Strain: Not on file  Food Insecurity: Not on file  Transportation Needs: Not on file  Physical Activity: Not on file  Stress: Not on file  Social Connections: Not on file  Intimate Partner Violence: Not on file    ROS      Objective    BP 136/80 (BP Location: Left Arm, Patient Position: Sitting,  Cuff Size: Large)   Pulse 82   Temp 97.6 F (36.4 C) (Temporal)   Ht '6\' 1"'$  (1.854 m)   Wt 297 lb (134.7 kg)   SpO2 98%   BMI 39.18 kg/m   Physical Exam  {Labs (Optional):23779}    Assessment & Plan:   Problem List Items Addressed This Visit       Other   Hyperlipidemia   Relevant Medications   lovastatin (MEVACOR) 20 MG tablet   Obesity (BMI 30-39.9) - Primary   Relevant Medications   tirzepatide (ZEPBOUND) 2.5 MG/0.5ML Pen   Other Visit Diagnoses     Encounter to establish care           Return for fasting CPE .   Harland Dingwall, NP-C

## 2022-08-23 ENCOUNTER — Telehealth: Payer: Self-pay

## 2022-08-23 NOTE — Telephone Encounter (Signed)
PA started for Zepbound  Key: [id number]

## 2022-08-23 NOTE — Telephone Encounter (Signed)
Pharmacy Patient Advocate Encounter  Prior Authorization for Zepbound has been approved by [employer] F[place] (ins).   Effective dates: 07/24/2022 through 02/19/2023

## 2022-08-24 ENCOUNTER — Ambulatory Visit: Payer: Federal, State, Local not specified - PPO | Admitting: Family Medicine

## 2022-09-01 ENCOUNTER — Ambulatory Visit: Payer: Federal, State, Local not specified - PPO | Admitting: Family Medicine

## 2022-09-02 ENCOUNTER — Encounter: Payer: Self-pay | Admitting: Family Medicine

## 2022-09-02 ENCOUNTER — Ambulatory Visit (INDEPENDENT_AMBULATORY_CARE_PROVIDER_SITE_OTHER): Payer: Federal, State, Local not specified - PPO | Admitting: Family Medicine

## 2022-09-02 VITALS — BP 124/76 | HR 81 | Temp 97.8°F | Ht 73.0 in | Wt 299.0 lb

## 2022-09-02 DIAGNOSIS — E785 Hyperlipidemia, unspecified: Secondary | ICD-10-CM

## 2022-09-02 DIAGNOSIS — Z0001 Encounter for general adult medical examination with abnormal findings: Secondary | ICD-10-CM | POA: Diagnosis not present

## 2022-09-02 DIAGNOSIS — R7303 Prediabetes: Secondary | ICD-10-CM | POA: Diagnosis not present

## 2022-09-02 DIAGNOSIS — E669 Obesity, unspecified: Secondary | ICD-10-CM | POA: Diagnosis not present

## 2022-09-02 LAB — COMPREHENSIVE METABOLIC PANEL
ALT: 21 U/L (ref 0–35)
AST: 20 U/L (ref 0–37)
Albumin: 4.1 g/dL (ref 3.5–5.2)
Alkaline Phosphatase: 89 U/L (ref 39–117)
BUN: 12 mg/dL (ref 6–23)
CO2: 27 mEq/L (ref 19–32)
Calcium: 9.5 mg/dL (ref 8.4–10.5)
Chloride: 103 mEq/L (ref 96–112)
Creatinine, Ser: 0.86 mg/dL (ref 0.40–1.20)
GFR: 80.4 mL/min (ref >60.00)
Glucose, Bld: 95 mg/dL (ref 70–99)
Potassium: 4.1 mEq/L (ref 3.5–5.1)
Sodium: 137 mEq/L (ref 135–145)
Total Bilirubin: 0.7 mg/dL (ref 0.2–1.2)
Total Protein: 7.4 g/dL (ref 6.0–8.3)

## 2022-09-02 LAB — CBC WITH DIFFERENTIAL/PLATELET
Basophils Absolute: 0 10*3/uL (ref 0.0–0.1)
Basophils Relative: 0.4 % (ref 0.0–3.0)
Eosinophils Absolute: 0.2 10*3/uL (ref 0.0–0.7)
Eosinophils Relative: 2.1 % (ref 0.0–5.0)
HCT: 41 % (ref 36.0–46.0)
Hemoglobin: 13.8 g/dL (ref 12.0–15.0)
Lymphocytes Relative: 26.4 % (ref 12.0–46.0)
Lymphs Abs: 2 10*3/uL (ref 0.7–4.0)
MCHC: 33.6 g/dL (ref 30.0–36.0)
MCV: 86.9 fl (ref 78.0–100.0)
Monocytes Absolute: 0.3 10*3/uL (ref 0.1–1.0)
Monocytes Relative: 4.1 % (ref 3.0–12.0)
Neutro Abs: 5 10*3/uL (ref 1.4–7.7)
Neutrophils Relative %: 67 % (ref 43.0–77.0)
Platelets: 389 10*3/uL (ref 150.0–400.0)
RBC: 4.72 Mil/uL (ref 3.87–5.11)
RDW: 13.4 % (ref 11.5–15.5)
WBC: 7.5 10*3/uL (ref 4.0–10.5)

## 2022-09-02 LAB — LIPID PANEL
Cholesterol: 258 mg/dL — ABNORMAL HIGH (ref 0–200)
HDL: 58.7 mg/dL (ref >39.00)
LDL Cholesterol: 172 mg/dL — ABNORMAL HIGH (ref 0–99)
NonHDL: 199.28
Total CHOL/HDL Ratio: 4
Triglycerides: 136 mg/dL (ref 0.0–149.0)
VLDL: 27.2 mg/dL (ref 0.0–40.0)

## 2022-09-02 LAB — HEMOGLOBIN A1C: Hgb A1c MFr Bld: 6.1 % (ref 4.6–6.5)

## 2022-09-02 LAB — TSH: TSH: 2.14 u[IU]/mL (ref 0.35–5.50)

## 2022-09-02 LAB — T4, FREE: Free T4: 0.77 ng/dL (ref 0.60–1.60)

## 2022-09-02 NOTE — Patient Instructions (Signed)
Preventive Care 40-48 Years Old, Female Preventive care refers to lifestyle choices and visits with your health care provider that can promote health and wellness. Preventive care visits are also called wellness exams. What can I expect for my preventive care visit? Counseling Your health care provider may ask you questions about your: Medical history, including: Past medical problems. Family medical history. Pregnancy history. Current health, including: Menstrual cycle. Method of birth control. Emotional well-being. Home life and relationship well-being. Sexual activity and sexual health. Lifestyle, including: Alcohol, nicotine or tobacco, and drug use. Access to firearms. Diet, exercise, and sleep habits. Work and work environment. Sunscreen use. Safety issues such as seatbelt and bike helmet use. Physical exam Your health care provider will check your: Height and weight. These may be used to calculate your BMI (body mass index). BMI is a measurement that tells if you are at a healthy weight. Waist circumference. This measures the distance around your waistline. This measurement also tells if you are at a healthy weight and may help predict your risk of certain diseases, such as type 2 diabetes and high blood pressure. Heart rate and blood pressure. Body temperature. Skin for abnormal spots. What immunizations do I need?  Vaccines are usually given at various ages, according to a schedule. Your health care provider will recommend vaccines for you based on your age, medical history, and lifestyle or other factors, such as travel or where you work. What tests do I need? Screening Your health care provider may recommend screening tests for certain conditions. This may include: Lipid and cholesterol levels. Diabetes screening. This is done by checking your blood sugar (glucose) after you have not eaten for a while (fasting). Pelvic exam and Pap test. Hepatitis B test. Hepatitis C  test. HIV (human immunodeficiency virus) test. STI (sexually transmitted infection) testing, if you are at risk. Lung cancer screening. Colorectal cancer screening. Mammogram. Talk with your health care provider about when you should start having regular mammograms. This may depend on whether you have a family history of breast cancer. BRCA-related cancer screening. This may be done if you have a family history of breast, ovarian, tubal, or peritoneal cancers. Bone density scan. This is done to screen for osteoporosis. Talk with your health care provider about your test results, treatment options, and if necessary, the need for more tests. Follow these instructions at home: Eating and drinking  Eat a diet that includes fresh fruits and vegetables, whole grains, lean protein, and low-fat dairy products. Take vitamin and mineral supplements as recommended by your health care provider. Do not drink alcohol if: Your health care provider tells you not to drink. You are pregnant, may be pregnant, or are planning to become pregnant. If you drink alcohol: Limit how much you have to 0-1 drink a day. Know how much alcohol is in your drink. In the [place] one drink equals one 12 oz bottle of beer (355 mL), one 5 oz glass of wine (148 mL), or one 1 oz glass of hard liquor (44 mL). Lifestyle Brush your teeth every morning and night with fluoride toothpaste. Floss one time each day. Exercise for at least 30 minutes 5 or more days each week. Do not use any products that contain nicotine or tobacco. These products include cigarettes, chewing tobacco, and vaping devices, such as e-cigarettes. If you need help quitting, ask your health care provider. Do not use drugs. If you are sexually active, practice safe sex. Use a condom or other form of protection to   prevent STIs. If you do not wish to become pregnant, use a form of birth control. If you plan to become pregnant, see your health care provider for a  prepregnancy visit. Take aspirin only as told by your health care provider. Make sure that you understand how much to take and what form to take. Work with your health care provider to find out whether it is safe and beneficial for you to take aspirin daily. Find healthy ways to manage stress, such as: Meditation, yoga, or listening to music. Journaling. Talking to a trusted person. Spending time with friends and family. Minimize exposure to UV radiation to reduce your risk of skin cancer. Safety Always wear your seat belt while driving or riding in a vehicle. Do not drive: If you have been drinking alcohol. Do not ride with someone who has been drinking. When you are tired or distracted. While texting. If you have been using any mind-altering substances or drugs. Wear a helmet and other protective equipment during sports activities. If you have firearms in your house, make sure you follow all gun safety procedures. Seek help if you have been physically or sexually abused. What's next? Visit your health care provider once a year for an annual wellness visit. Ask your health care provider how often you should have your eyes and teeth checked. Stay up to date on all vaccines. This information is not intended to replace advice given to you by your health care provider. Make sure you discuss any questions you have with your health care provider. Document Revised: 11/26/2020 Document Reviewed: 11/26/2020 Elsevier Patient Education  2023 Elsevier Inc.  

## 2022-09-02 NOTE — Progress Notes (Addendum)
Complete physical exam  Patient: Kristina Chambers   DOB: Jul 26, 1975   47 y.o. Female  MRN: [id number]  Subjective:    Chief Complaint  Patient presents with   Annual Exam    fasting   She is fairly new to the practice and here for a complete physical exam.  Other providers- OB/GYN- Dr. [facility]my [facility]    Health Maintenance  Topic Date Due   COVID-19 Vaccine (4 - 2023-24 season) 09/05/2022*   Flu Shot  09/12/2022*   HIV Screening  05/15/2032*   Colon Cancer Screening  09/25/2024   Pap Smear  10/15/2024   DTaP/Tdap/Td vaccine (2 - Td or Tdap) 12/25/2028   HPV Vaccine  Completed   Hepatitis C Screening: USPSTF Recommendation to screen - Ages 19-79 yo.  Completed  *Topic was postponed. The date shown is not the original due date.    Wears seatbelt always, uses sunscreen, smoke detectors in home and functioning, does not text while driving, feels safe in home environment.  Depression screening:    09/02/2022    9:17 AM 08/20/2022    9:44 AM 01/13/2022    1:05 PM  Depression screen PHQ 2/9  Decreased Interest 0 0 0  Down, Depressed, Hopeless 0 0 0  PHQ - 2 Score 0 0 0  Altered sleeping  1   Tired, decreased energy  0   Change in appetite  1   Feeling bad or failure about yourself   0   Trouble concentrating  0   Moving slowly or fidgety/restless  0   Suicidal thoughts  0   PHQ-9 Score  2   Difficult doing work/chores  Not difficult at all Not difficult at all   Anxiety Screening:    11/25/2021    1:56 PM 10/30/2021   11:10 AM 09/28/2021    3:37 PM 08/25/2021    4:07 PM  GAD 7 : Generalized Anxiety Score  Nervous, Anxious, on Edge 0 0 0 0  Control/stop worrying 0 0 0 0  Worry too much - different things 0 0 0 0  Trouble relaxing 0 0 0 1  Restless 0 0 0 0  Easily annoyed or irritable 0 0 0 0  Afraid - awful might happen 0 0 0 0  Total GAD 7 Score 0 0 0 1  Anxiety Difficulty Not difficult at all Not difficult at all Not difficult at all Not difficult at  all    Vision:Within last year, Dental: No current dental problems and Receives regular dental care, and STD: no concerns  Patient Active Problem List   Diagnosis Date Noted   Alcohol abuse, 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/20/2022   Class 2 severe obesity with serious comorbidity and body mass index (BMI) of 37.0 to 37.9 in adult ([place]) 09/28/2021   Hyperlipidemia 01/15/2020   Obesity (BMI 30-39.9) 01/15/2020   MDD (major depressive disorder) 02/21/2019   GAD (generalized anxiety disorder) 02/21/2019   Past Medical History:  Diagnosis Date   Anxiety    Chicken pox    Depression    Elevated cholesterol 2020   GERD (gastroesophageal reflux disease) 2023   HPV in female    Lichen sclerosus [id number]   Severe dysplasia of cervix (CIN III) 2021   Substance abuse ([place])    Past Surgical History:  Procedure Laterality Date   CLAVICLE SURGERY     FRACTURE SURGERY  2010   GYNECOLOGIC CRYOSURGERY     LEEP  03/11/2020   CIN  II, margins positive for CIN I   meniscus right     Social History   Tobacco Use   Smoking status: Former    Types: Cigarettes    Quit date: 06/14/2008    Years since quitting: 14.2   Smokeless tobacco: Never  Vaping Use   Vaping Use: Never used  Substance Use Topics   Alcohol use: Not Currently   Drug use: Never      Patient Care Team: Girtha Rm, NP-C as PCP - General (Family Medicine) Nunzio Cobbs, MD as Consulting Physician (Obstetrics and Gynecology)   Outpatient Medications Prior to Visit  Medication Sig   ARIPiprazole (ABILIFY) 15 MG tablet Take one tablet daily.   buPROPion (WELLBUTRIN XL) 150 MG 24 hr tablet Take 1 tablet (150 mg total) by mouth daily.   buPROPion (WELLBUTRIN XL) 300 MG 24 hr tablet TAKE 1 TABLET BY MOUTH EVERY MORNING   clobetasol ointment (TEMOVATE) 0.05 % Apply to area in a thin layer bid for 2 weeks as needed.  May apply once daily at night time, twice a week for maintenance dose.   escitalopram (LEXAPRO) 10 MG tablet Take  1 tablet (10 mg total) by mouth daily.   lamoTRIgine (LAMICTAL) 150 MG tablet Take 1 tablet (150 mg total) by mouth daily.   levonorgestrel (MIRENA) 20 MCG/24HR IUD by Intrauterine route.   lovastatin (MEVACOR) 20 MG tablet Take 1 tablet (20 mg total) by mouth at bedtime.   [DISCONTINUED] tirzepatide (ZEPBOUND) 2.5 MG/0.5ML Pen Inject 2.5 mg into the skin once a week.   No facility-administered medications prior to visit.    Review of Systems  Constitutional:  Negative for chills and fever.  HENT:  Negative for congestion, ear pain, sinus pain and sore throat.   Eyes:  Negative for blurred vision, double vision and pain.  Respiratory:  Negative for cough, shortness of breath and wheezing.   Cardiovascular:  Negative for chest pain, palpitations and leg swelling.  Gastrointestinal:  Negative for abdominal pain, constipation, diarrhea, nausea and vomiting.  Genitourinary:  Negative for dysuria, frequency and urgency.  Musculoskeletal:  Negative for back pain, joint pain and myalgias.  Skin:  Negative for rash.  Neurological:  Negative for dizziness, tingling, focal weakness and headaches.  Endo/Heme/Allergies:  Does not bruise/bleed easily.  Psychiatric/Behavioral:  Negative for depression, memory loss and suicidal ideas. The patient is not nervous/anxious.        Objective:    BP 124/76 (BP Location: Left Arm, Patient Position: Sitting, Cuff Size: Large)   Pulse 81   Temp 97.8 F (36.6 C) (Temporal)   Ht 6\' 1"  (1.854 m)   Wt 299 lb (135.6 kg)   SpO2 95%   BMI 39.45 kg/m  BP Readings from Last 3 Encounters:  09/02/22 124/76  08/20/22 136/80  11/19/21 110/66   Wt Readings from Last 3 Encounters:  09/02/22 299 lb (135.6 kg)  08/20/22 297 lb (134.7 kg)  04/13/22 286 lb (129.7 kg)    Physical Exam Constitutional:      General: She is not in acute distress. HENT:     Right Ear: Tympanic membrane, ear canal and external ear normal.     Left Ear: Tympanic membrane, ear  canal and external ear normal.     Nose: Nose normal.     Mouth/Throat:     Mouth: Mucous membranes are moist.     Pharynx: Oropharynx is clear.  Eyes:     Extraocular Movements: Extraocular movements intact.  Conjunctiva/sclera: Conjunctivae normal.     Pupils: Pupils are equal, round, and reactive to light.  Neck:     Thyroid: No thyroid mass, thyromegaly or thyroid tenderness.  Cardiovascular:     Rate and Rhythm: Normal rate and regular rhythm.     Pulses: Normal pulses.     Heart sounds: Normal heart sounds.  Pulmonary:     Effort: Pulmonary effort is normal.     Breath sounds: Normal breath sounds.  Abdominal:     General: Bowel sounds are normal.     Palpations: Abdomen is soft.     Tenderness: There is no abdominal tenderness. There is no right CVA tenderness, left CVA tenderness, guarding or rebound.  Musculoskeletal:        General: Normal range of motion.     Cervical back: Normal range of motion and neck supple. No tenderness.     Right lower leg: No edema.     Left lower leg: No edema.  Lymphadenopathy:     Cervical: No cervical adenopathy.  Skin:    General: Skin is warm and dry.     Findings: No lesion or rash.  Neurological:     General: No focal deficit present.     Mental Status: She is alert and oriented to person, place, and time.     Cranial Nerves: No cranial nerve deficit.     Sensory: No sensory deficit.     Motor: No weakness.     Gait: Gait normal.  Psychiatric:        Mood and Affect: Mood normal.        Behavior: Behavior normal.        Thought Content: Thought content normal.      No results found for any visits on 09/02/22.    Assessment & Plan:    Routine Health Maintenance and Physical Exam  Problem List Items Addressed This Visit       Other   Hyperlipidemia   Relevant Orders   Lipid panel   Amb Ref to Medical Weight Management   Obesity (BMI 30-39.9)   Relevant Orders   CBC with Differential/Platelet   Comprehensive  metabolic panel   TSH   T4, free   Amb Ref to Medical Weight Management   Other Visit Diagnoses     Encounter for general adult medical examination with abnormal findings    -  Primary   Prediabetes       Relevant Orders   CBC with Differential/Platelet   Comprehensive metabolic panel   Hemoglobin A1c   TSH   T4, free   Amb Ref to Medical Weight Management      Preventive health care reviewed. She sees OB/GYN. UTD with colonoscopy.  Counseling on healthy lifestyle including diet and exercise. Try to limit snacking in the evenings.  Zepbound was not covered unfortunately. Referral placed to [facility]. Recommend regular dental and eye exams.  Immunizations reviewed.  Discussed safety.   Return in about 1 year (around 09/02/2023).     Harland Dingwall, NP-C

## 2022-09-06 ENCOUNTER — Encounter: Payer: Self-pay | Admitting: Family Medicine

## 2022-09-06 DIAGNOSIS — E785 Hyperlipidemia, unspecified: Secondary | ICD-10-CM

## 2022-09-06 MED ORDER — LOVASTATIN 40 MG PO TABS: 40.0000 mg | ORAL_TABLET | Freq: Every day | ORAL | 0 refills | Status: DC

## 2022-09-06 NOTE — Telephone Encounter (Signed)
Rx sent 

## 2022-09-06 NOTE — Telephone Encounter (Signed)
Ok for new lovastatin 40 mg rx. Pt will double 20 mg medication until filled

## 2022-10-01 ENCOUNTER — Ambulatory Visit: Payer: Federal, State, Local not specified - PPO | Admitting: Adult Health

## 2022-10-01 ENCOUNTER — Encounter: Payer: Self-pay | Admitting: Adult Health

## 2022-10-01 ENCOUNTER — Encounter: Payer: Self-pay | Admitting: Family Medicine

## 2022-10-01 DIAGNOSIS — F411 Generalized anxiety disorder: Secondary | ICD-10-CM | POA: Diagnosis not present

## 2022-10-01 DIAGNOSIS — F331 Major depressive disorder, recurrent, moderate: Secondary | ICD-10-CM | POA: Diagnosis not present

## 2022-10-01 MED ORDER — ARIPIPRAZOLE 15 MG PO TABS
ORAL_TABLET | ORAL | 3 refills | Status: AC
Start: 2022-10-01 — End: ?

## 2022-10-01 MED ORDER — BUPROPION HCL ER (XL) 150 MG PO TB24
150.0000 mg | ORAL_TABLET | Freq: Every day | ORAL | 3 refills | Status: AC
Start: 2022-10-01 — End: ?

## 2022-10-01 MED ORDER — LAMOTRIGINE 150 MG PO TABS: 150.0000 mg | ORAL_TABLET | Freq: Every day | ORAL | 3 refills | Status: AC

## 2022-10-01 MED ORDER — BUPROPION HCL ER (XL) 300 MG PO TB24: ORAL_TABLET | ORAL | 3 refills | Status: AC

## 2022-10-01 MED ORDER — ESCITALOPRAM OXALATE 10 MG PO TABS
10.0000 mg | ORAL_TABLET | Freq: Every day | ORAL | 3 refills | Status: AC
Start: 2022-10-01 — End: ?

## 2022-10-01 NOTE — Progress Notes (Signed)
Kristina Chambers [id number] 1976-05-31 47 y.o.  Subjective:   Patient ID:  Kristina Chambers is a 47 y.o. (DOB 1975/11/09) female.  Chief Complaint: No chief complaint on file.   HPI Kristina Chambers presents to the office today for follow-up of MDD, GAD and insomnia.  Describes mood today as "so-so". Pleasant. Denies tearfulness. Mood symptoms - denies depression, anxiety and irritability. Mood is consistent. Stating "I'm doing alright". Feels like medications are working well. Taking medications as prescribed.  Energy levels stable. Active, has a regular exercise routine.   Enjoys some usual interests and activities. Single. Lives with son.  Appetite adequate. Weight gain - 286 to 300 pounds. Sleeps wells most nights. Averages 7 to 8 hours. Focus and concentration stable. Completing tasks. Managing some aspects of household. Works full time for the [employer]. Denies SI or HI.  Denies AH or VH.  Previous medications: Wellbutrin, Cymbalta, Lexapro     GAD-7    Flowsheet Row Office Visit from 11/25/2021 in [facility] Visit from 10/30/2021 in [facility] Visit from 09/28/2021 in [facility] at [facility] Visit from 08/25/2021 in [facility]  Total GAD-7 Score 0 0 0 1      PHQ2-9    Flowsheet Row Office Visit from 09/02/2022 in [facility] Office Visit from 08/20/2022 in [facility] Video Visit from 01/13/2022 in [facility] at [facility] Visit from 11/25/2021 in [facility] Visit from 10/30/2021 in [facility] at [facility]  PHQ-2 Total Score 0 0 0 0 0  PHQ-9 Total Score -- 2 -- 0 2      Flowsheet Row ED from 06/01/2021 in [facility])  C-SSRS RISK CATEGORY No Risk        Review of Systems:   Review of Systems  Musculoskeletal:  Negative for gait problem.  Neurological:  Negative for tremors.  Psychiatric/Behavioral:         Please refer to HPI    Medications: I have reviewed the patient's current medications.  Current Outpatient Medications  Medication Sig Dispense Refill   ARIPiprazole (ABILIFY) 15 MG tablet Take one tablet daily. 90 tablet 3   buPROPion (WELLBUTRIN XL) 150 MG 24 hr tablet Take 1 tablet (150 mg total) by mouth daily. 90 tablet 3   buPROPion (WELLBUTRIN XL) 300 MG 24 hr tablet TAKE 1 TABLET BY MOUTH EVERY MORNING 90 tablet 3   clobetasol ointment (TEMOVATE) 0.05 % Apply to area in a thin layer bid for 2 weeks as needed.  May apply once daily at night time, twice a week for maintenance dose. 30 g 1   escitalopram (LEXAPRO) 10 MG tablet Take 1 tablet (10 mg total) by mouth daily. 90 tablet 3   lamoTRIgine (LAMICTAL) 150 MG tablet Take 1 tablet (150 mg total) by mouth daily. 90 tablet 3   levonorgestrel (MIRENA) 20 MCG/24HR IUD by Intrauterine route.     lovastatin (MEVACOR) 40 MG tablet Take 1 tablet (40 mg total) by mouth at bedtime. 90 tablet 0   No current facility-administered medications for this visit.    Medication Side Effects: None  Allergies: No Known Allergies  Past Medical History:  Diagnosis Date   Anxiety    Chicken pox  Depression    Elevated cholesterol 2020   GERD (gastroesophageal reflux disease) 2023   HPV in female    Lichen sclerosus 2021   Severe dysplasia of cervix (CIN III) 2021   Substance abuse ([facility])     Past Medical History, Surgical history, Social history, and Family history were reviewed and updated as appropriate.   Please see review of systems for further details on the patient's review from today.   Objective:   Physical Exam:  There were no vitals taken for this visit.  Physical Exam Constitutional:      General: She is not in acute distress. Musculoskeletal:        General: No deformity.   Neurological:     Mental Status: She is alert and oriented to person, place, and time.     Coordination: Coordination normal.  Psychiatric:        Attention and Perception: Attention and perception normal. She does not perceive auditory or visual hallucinations.        Mood and Affect: Mood normal. Mood is not anxious or depressed. Affect is not labile, blunt, angry or inappropriate.        Speech: Speech normal.        Behavior: Behavior normal.        Thought Content: Thought content normal. Thought content is not paranoid or delusional. Thought content does not include homicidal or suicidal ideation. Thought content does not include homicidal or suicidal plan.        Cognition and Memory: Cognition and memory normal.        Judgment: Judgment normal.     Comments: Insight intact     Lab Review:     Component Value Date/Time   NA 137 09/02/2022 0947   NA 138 12/26/2018 1144   K 4.1 09/02/2022 0947   CL 103 09/02/2022 0947   CO2 27 09/02/2022 0947   GLUCOSE 95 09/02/2022 0947   BUN 12 09/02/2022 0947   BUN 10 12/26/2018 1144   CREATININE 0.86 09/02/2022 0947   CREATININE 0.86 05/30/2020 1021   CALCIUM 9.5 09/02/2022 0947   PROT 7.4 09/02/2022 0947   PROT 6.9 12/26/2018 1144   ALBUMIN 4.1 09/02/2022 0947   ALBUMIN 4.4 12/26/2018 1144   AST 20 09/02/2022 0947   ALT 21 09/02/2022 0947   ALKPHOS 89 09/02/2022 0947   BILITOT 0.7 09/02/2022 0947   BILITOT 1.0 12/26/2018 1144   GFRNONAA 82 05/30/2020 1021   GFRAA 95 05/30/2020 1021       Component Value Date/Time   WBC 7.5 09/02/2022 0947   RBC 4.72 09/02/2022 0947   HGB 13.8 09/02/2022 0947   HGB 13.0 12/26/2018 1144   HCT 41.0 09/02/2022 0947   HCT 38.6 12/26/2018 1144   PLT 389.0 09/02/2022 0947   PLT 337 12/26/2018 1144   MCV 86.9 09/02/2022 0947   MCV 87 12/26/2018 1144   MCH 29.3 12/26/2018 1144   MCHC 33.6 09/02/2022 0947   RDW 13.4 09/02/2022 0947   RDW 11.7 12/26/2018 1144   LYMPHSABS 2.0 09/02/2022 0947    MONOABS 0.3 09/02/2022 0947   EOSABS 0.2 09/02/2022 0947   BASOSABS 0.0 09/02/2022 0947    No results found for: "POCLITH", "LITHIUM"   No results found for: "PHENYTOIN", "PHENOBARB", "VALPROATE", "CBMZ"   .res Assessment: Plan:    Plan:    Lexapro 10 mg daily - anxiety Lamictal  daily - mood instability Abilify  daily to target depression Wellbutrin XL  daily - depression  Wellbutrin XL  daily - depression  [facility]. Plans to contact PCP for possible phentermine trial while awaiting appt at [facility].  Uses Melatonin some nights  RTC 6 months  Patient advised to contact office with any questions, adverse effects, or acute worsening in signs and symptoms.  Discussed potential metabolic side effects associated with atypical antipsychotics, as well as potential risk for movement side effects. Advised pt to contact office if movement side effects occur.   Counseled patient regarding potential benefits, risks, and side effects of Lamictal to include potential risk of Stevens-Johnson syndrome. Advised patient to stop taking Lamictal and contact office immediately if rash develops and to seek urgent medical attention if rash is severe and/or spreading quickly.  Discussed potential benefits, risk, and side effects of benzodiazepines to include potential risk of tolerance and dependence, as well as possible drowsiness.  Advised patient not to drive if experiencing drowsiness and to take lowest possible effective dose to minimize risk of dependence and tolerance.   Diagnoses and all orders for this visit:  Major depressive disorder, recurrent episode, moderate -     lamoTRIgine (LAMICTAL) 150 MG tablet; Take 1 tablet (150 mg total) by mouth daily. -     escitalopram (LEXAPRO) 10 MG tablet; Take 1 tablet (10 mg total) by mouth daily. -     buPROPion (WELLBUTRIN XL) 150 MG 24 hr tablet; Take 1 tablet (150 mg total) by mouth daily. -      ARIPiprazole (ABILIFY) 15 MG tablet; Take one tablet daily.  Generalized anxiety disorder -     lamoTRIgine (LAMICTAL) 150 MG tablet; Take 1 tablet (150 mg total) by mouth daily. -     escitalopram (LEXAPRO) 10 MG tablet; Take 1 tablet (10 mg total) by mouth daily. -     ARIPiprazole (ABILIFY) 15 MG tablet; Take one tablet daily.  Other orders -     buPROPion (WELLBUTRIN XL) 300 MG 24 hr tablet; TAKE 1 TABLET BY MOUTH EVERY MORNING     Please see After Visit Summary for patient specific instructions.  Future Appointments  Date Time Provider Department Center  10/25/2022  8:00 AM Patton Salles, MD [facility] None  04/01/2023  8:20 AM Labib Cwynar, Thereasa Solo, NP [facility] None    No orders of the defined types were placed in this encounter.   -------------------------------

## 2022-10-01 NOTE — Telephone Encounter (Signed)
Pt has been trying to get into weight management and will attempt to call them again. Met w mental health provider and she told pt to request 3 month trial of phentermine from PCP

## 2022-10-11 NOTE — Progress Notes (Signed)
47 y.o. [id number] Single Caucasian female here for annual exam.    Has pelvic US last year to locate IUD, as strings were not seen with office exam.  [place] confirmed IUD to be in a normal position. Occasional bleeding with her IUD.  Not sexually active.  Declines STD screening.   Not using Clobetasol regularly.   PCP:   Hetty Blend, NP, Michell Heinrich, PA-C  No LMP recorded (lmp unknown). (Menstrual status: IUD).           Sexually active: No.  The current method of family planning is IUD--Mirena 11/2015.    Exercising: Yes.     Workout group 3x, walking everyday Smoker:  former  Health Maintenance: Pap:  10/15/21 neg: HR HPV neg, 09/24/20 neg: HR HPV neg History of abnormal Pap:  yes, 03-11-20 LEEP CIN II and negative margins for HGSIL but positive margins with LGSIL.  Her endocervical button was normal, and her ECC was also normal and free of dysplasia. 02-20-21 colpo CIN II-III. 01-23-20 Neg:Pos HR HPV. 12-26-18 ASCUS:Pos HR HPV--colpo revealed LGSIL--no treatment, Hx of cryotherapy to cervix  MMG:  07/23/22 Breast density Cat B, BI-RADS CAT 1 neg Colonoscopy:  09/26/19 - polyps noted - due in 5 years, 2026 BMD:   n/a  Result  n/a TDaP:  12/26/18 Gardasil:   yes HIV: neg in past Hep C: 05/15/21 NR Screening Labs:  PCP   reports that she quit smoking about 14 years ago. Her smoking use included cigarettes. She has never used smokeless tobacco. She reports that she does not currently use alcohol. She reports that she does not use drugs.  Past Medical History:  Diagnosis Date   Anxiety    Chicken pox    Depression    Elevated cholesterol 2020   GERD (gastroesophageal reflux disease) 2023   HPV in female    Lichen sclerosus 2021   Severe dysplasia of cervix (CIN III) 2021   Substance abuse ([facility])     Past Surgical History:  Procedure Laterality Date   CLAVICLE SURGERY     FRACTURE SURGERY  2010   GYNECOLOGIC CRYOSURGERY     LEEP  03/11/2020   CIN II, margins positive for CIN I    meniscus right      Current Outpatient Medications  Medication Sig Dispense Refill   ARIPiprazole (ABILIFY) 15 MG tablet Take one tablet daily. 90 tablet 3   buPROPion (WELLBUTRIN XL) 150 MG 24 hr tablet Take 1 tablet (150 mg total) by mouth daily. 90 tablet 3   buPROPion (WELLBUTRIN XL) 300 MG 24 hr tablet TAKE 1 TABLET BY MOUTH EVERY MORNING 90 tablet 3   clobetasol ointment (TEMOVATE) 0.05 % Apply to area in a thin layer bid for 2 weeks as needed.  May apply once daily at night time, twice a week for maintenance dose. 30 g 1   escitalopram (LEXAPRO) 10 MG tablet Take 1 tablet (10 mg total) by mouth daily. 90 tablet 3   lamoTRIgine (LAMICTAL) 150 MG tablet Take 1 tablet (150 mg total) by mouth daily. 90 tablet 3   levonorgestrel (MIRENA) 20 MCG/24HR IUD by Intrauterine route.     lovastatin (MEVACOR) 40 MG tablet Take 1 tablet (40 mg total) by mouth at bedtime. 90 tablet 0   No current facility-administered medications for this visit.    Family History  Problem Relation Age of Onset   Hypertension Mother    Colon polyps Mother    Depression Mother    Lupus Maternal  Grandmother    Colon cancer Paternal Grandfather    Stomach cancer Neg Hx    Esophageal cancer Neg Hx    Breast cancer Neg Hx     Review of Systems  All other systems reviewed and are negative.   Exam:   BP 120/76 (BP Location: Left Arm, Patient Position: Sitting, Cuff Size: Large)   Pulse 85   Ht 6\' 1"  (1.854 m)   Wt 297 lb (134.7 kg)   LMP  (LMP Unknown)   SpO2 96%   BMI 39.18 kg/m     General appearance: alert, cooperative and appears stated age Head: normocephalic, without obvious abnormality, atraumatic Neck: no adenopathy, supple, symmetrical, trachea midline and thyroid normal to inspection and palpation Lungs: clear to auscultation bilaterally Breasts: normal appearance, no masses or tenderness, No nipple retraction or dimpling, No nipple discharge or bleeding, No axillary adenopathy Heart: regular  rate and rhythm Abdomen: soft, non-tender; no masses, no organomegaly Extremities: extremities normal, atraumatic, no cyanosis or edema Skin: skin color, texture, turgor normal. No rashes or lesions Lymph nodes: cervical, supraclavicular, and axillary nodes normal. Neurologic: grossly normal  Pelvic: External genitalia:  hypopigmentation of infraclitoral region, perineum, and perianal region bilaterally.               No abnormal inguinal nodes palpated.              Urethra:  normal appearing urethra with no masses, tenderness or lesions              Bartholins and Skenes: normal                 Vagina: normal appearing vagina with normal color and discharge, no lesions              Cervix: no lesions.  IUD strings not seen.               Pap taken: yes Bimanual Exam:  Uterus:  normal size, contour, position, consistency, mobility, non-tender              Adnexa: no mass, fullness, tenderness              Rectal exam: yes.  Confirms.              Anus:  normal sphincter tone, no lesions  Chaperone was present for exam:  Warren Lacy, CMA  Assessment:   Well woman visit with gynecologic exam. Mirena IUD.   IUD strings not seen.  Hx CIN III on colposcopy, status post LEEP.  Lichen sclerosus.  Vulvar lesion.  Perianal changes.  Hyperlipidemia.   Plan: Mammogram screening discussed. Self breast awareness reviewed. Pap and HR HPV collected.  Guidelines for Calcium, Vitamin D, regular exercise program including cardiovascular and weight bearing exercise. IUD exchange can be done at the end of this year or before June, 2024. Refill of Clobetasol ointment.   Labs with PCP. Follow up annually and prn.   After visit summary provided.

## 2022-10-12 ENCOUNTER — Ambulatory Visit (INDEPENDENT_AMBULATORY_CARE_PROVIDER_SITE_OTHER): Payer: Federal, State, Local not specified - PPO | Admitting: Nurse Practitioner

## 2022-10-12 ENCOUNTER — Encounter: Payer: Self-pay | Admitting: Nurse Practitioner

## 2022-10-12 VITALS — BP 115/77 | HR 74 | Temp 98.2°F | Ht 73.0 in | Wt 296.0 lb

## 2022-10-12 DIAGNOSIS — Z6839 Body mass index (BMI) 39.0-39.9, adult: Secondary | ICD-10-CM | POA: Diagnosis not present

## 2022-10-12 DIAGNOSIS — E669 Obesity, unspecified: Secondary | ICD-10-CM | POA: Diagnosis not present

## 2022-10-12 DIAGNOSIS — E785 Hyperlipidemia, unspecified: Secondary | ICD-10-CM

## 2022-10-12 NOTE — Progress Notes (Signed)
Office: [phone]  /  Fax: [phone]   Initial Visit  CHRISHONDA HESCH was seen in clinic today to evaluate for obesity. She is interested in losing weight to improve overall health and reduce the risk of weight related complications. She presents today to review program treatment options, initial physical assessment, and evaluation.     She was referred by: PCP  When asked what else they would like to accomplish? She states: Improve existing medical conditions, Improve quality of life, and Lose a target amount of weight : goal weight:  <200 lbs  Weight history:  She started gaining weight as a child.  She has always struggled with her weight.  She started gaining excess weight 5 years ago when she moved here and changed jobs.    When asked how has your weight affected you? She states: Contributed to orthopedic problems or mobility issues and Having fatigue  Some associated conditions: history of fatty liver, HLD, GAD, MDD  Contributing factors: Family history, Medications, Stress, Life event, and Pregnancy  Weight promoting medications identified: Psychotropic medications and Contraceptives or hormonal therapy  Current nutrition plan: None  Current level of physical activity: She is walking daily, 4 days per week for 45 minutes-resistance training and cardio  Current or previous pharmacotherapy: She has tried Ozempic-did well and lost 30 pounds in 3 months-had to stop due to side effects.  She also tried [place] and stopped due to cost.  She started tirzepatide  2.5mg  (started 2 weeks ago)-denies side effects.   Response to medication: see above   Past medical history includes:   Past Medical History:  Diagnosis Date   Anxiety    Chicken pox    Depression    Elevated cholesterol 2020   GERD (gastroesophageal reflux disease) 2023   HPV in female    Lichen sclerosus 2021   Severe dysplasia of cervix (CIN III) 2021   Substance abuse (HCC)      Objective:   BP 115/77    Pulse 74   Temp 98.2 F (36.8 C)   Ht 6\' 1"  (1.854 m)   Wt 296 lb (134.3 kg)   LMP  (LMP Unknown)   SpO2 97%   BMI 39.05 kg/m  She was weighed on the bioimpedance scale: Body mass index is 39.05 kg/m.  Body Fat%:47.5, Visceral Fat Rating:13, Weight trend over the last 12 months: Increasing  General:  Alert, oriented and cooperative. Patient is in no acute distress.  Respiratory: Normal respiratory effort, no problems with respiration noted   Gait: able to ambulate independently  Mental Status: Normal mood and affect. Normal behavior. Normal judgment and thought content.   DIAGNOSTIC DATA REVIEWED:  BMET    Component Value Date/Time   NA 137 09/02/2022 0947   NA 138 12/26/2018 1144   K 4.1 09/02/2022 0947   CL 103 09/02/2022 0947   CO2 27 09/02/2022 0947   GLUCOSE 95 09/02/2022 0947   BUN 12 09/02/2022 0947   BUN 10 12/26/2018 1144   CREATININE 0.86 09/02/2022 0947   CREATININE 0.86 05/30/2020 1021   CALCIUM 9.5 09/02/2022 0947   GFRNONAA 82 05/30/2020 1021   GFRAA 95 05/30/2020 1021   Lab Results  Component Value Date   HGBA1C 6.1 09/02/2022   HGBA1C 5.8 (H) 01/15/2020   No results found for: "INSULIN" CBC    Component Value Date/Time   WBC 7.5 09/02/2022 0947   RBC 4.72 09/02/2022 0947   HGB 13.8 09/02/2022 0947   HGB 13.0 12/26/2018  1144   HCT 41.0 09/02/2022 0947   HCT 38.6 12/26/2018 1144   PLT 389.0 09/02/2022 0947   PLT 337 12/26/2018 1144   MCV 86.9 09/02/2022 0947   MCV 87 12/26/2018 1144   MCH 29.3 12/26/2018 1144   MCHC 33.6 09/02/2022 0947   RDW 13.4 09/02/2022 0947   RDW 11.7 12/26/2018 1144   Iron/TIBC/Ferritin/ %Sat No results found for: "IRON", "TIBC", "FERRITIN", "IRONPCTSAT" Lipid Panel     Component Value Date/Time   CHOL 258 (H) 09/02/2022 0947   CHOL 251 (H) 12/26/2018 1144   TRIG 136.0 09/02/2022 0947   HDL 58.70 09/02/2022 0947   HDL 54 12/26/2018 1144   CHOLHDL 4 09/02/2022 0947   VLDL 27.2 09/02/2022 0947   LDLCALC 172  (H) 09/02/2022 0947   LDLCALC 108 (H) 05/30/2020 1021   LDLDIRECT 134.0 05/15/2021 0907   Hepatic Function Panel     Component Value Date/Time   PROT 7.4 09/02/2022 0947   PROT 6.9 12/26/2018 1144   ALBUMIN 4.1 09/02/2022 0947   ALBUMIN 4.4 12/26/2018 1144   AST 20 09/02/2022 0947   ALT 21 09/02/2022 0947   ALKPHOS 89 09/02/2022 0947   BILITOT 0.7 09/02/2022 0947   BILITOT 1.0 12/26/2018 1144      Component Value Date/Time   TSH 2.14 09/02/2022 0947     Assessment and Plan:   Hyperlipidemia, unspecified hyperlipidemia type Continue to follow up with PCP.  Continue meds as directed.   Generalized obesity  BMI 39.0-39.9,adult        Obesity Treatment / Action Plan:  Patient will work on garnering support from family and friends to begin weight loss journey. Will work on eliminating or reducing the presence of highly palatable, calorie dense foods in the home. Will complete provided nutritional and psychosocial assessment questionnaire before the next appointment. Will be scheduled for indirect calorimetry to determine resting energy expenditure in a fasting state.  This will allow [place] to create a reduced calorie, high-protein meal plan to promote loss of fat mass while preserving muscle mass.  Obesity Education Performed Today:  She was weighed on the bioimpedance scale and results were discussed and documented in the synopsis.  We discussed obesity as a disease and the importance of a more detailed evaluation of all the factors contributing to the disease.  We discussed the importance of long term lifestyle changes which include nutrition, exercise and behavioral modifications as well as the importance of customizing this to her specific health and social needs.  We discussed the benefits of reaching a healthier weight to alleviate the symptoms of existing conditions and reduce the risks of the biomechanical, metabolic and psychological effects of obesity.  Chaquana S  Gin appears to be in the action stage of change and states they are ready to start intensive lifestyle modifications and behavioral modifications.  30 minutes was spent today on this visit including the above counseling, pre-visit chart review, and post-visit documentation.  Reviewed by clinician on day of visit: allergies, medications, problem list, medical history, surgical history, family history, social history, and previous encounter notes pertinent to obesity diagnosis.    Theodis Sato Colette Dicamillo FNP-C

## 2022-10-12 NOTE — Patient Instructions (Signed)

## 2022-10-13 DIAGNOSIS — Z0289 Encounter for other administrative examinations: Secondary | ICD-10-CM

## 2022-10-18 ENCOUNTER — Ambulatory Visit: Payer: Federal, State, Local not specified - PPO | Admitting: Obstetrics and Gynecology

## 2022-10-25 ENCOUNTER — Ambulatory Visit (INDEPENDENT_AMBULATORY_CARE_PROVIDER_SITE_OTHER): Payer: Federal, State, Local not specified - PPO | Admitting: Obstetrics and Gynecology

## 2022-10-25 ENCOUNTER — Other Ambulatory Visit (HOSPITAL_COMMUNITY)
Admission: RE | Admit: 2022-10-25 | Discharge: 2022-10-25 | Disposition: A | Discharge status: Home / Self Care | Payer: Federal, State, Local not specified - PPO | Source: Ambulatory Visit | Attending: Obstetrics and Gynecology | Admitting: Obstetrics and Gynecology

## 2022-10-25 ENCOUNTER — Encounter: Payer: Self-pay | Admitting: Obstetrics and Gynecology

## 2022-10-25 VITALS — BP 120/76 | HR 85 | Ht 73.0 in | Wt 297.0 lb

## 2022-10-25 DIAGNOSIS — Z124 Encounter for screening for malignant neoplasm of cervix: Secondary | ICD-10-CM | POA: Diagnosis not present

## 2022-10-25 DIAGNOSIS — Z01419 Encounter for gynecological examination (general) (routine) without abnormal findings: Secondary | ICD-10-CM

## 2022-10-25 DIAGNOSIS — Z87410 Personal history of cervical dysplasia: Secondary | ICD-10-CM | POA: Diagnosis not present

## 2022-10-25 MED ORDER — CLOBETASOL PROPIONATE 0.05 % EX OINT: TOPICAL_OINTMENT | CUTANEOUS | 1 refills | Status: AC

## 2022-10-25 NOTE — Patient Instructions (Signed)

## 2022-10-27 LAB — CYTOLOGY - PAP
Comment: NEGATIVE
Diagnosis: NEGATIVE
High risk HPV: NEGATIVE

## 2022-11-03 ENCOUNTER — Encounter: Payer: Self-pay | Admitting: Bariatrics

## 2022-11-03 ENCOUNTER — Ambulatory Visit (INDEPENDENT_AMBULATORY_CARE_PROVIDER_SITE_OTHER): Payer: Federal, State, Local not specified - PPO | Admitting: Bariatrics

## 2022-11-03 VITALS — BP 102/71 | HR 83 | Temp 98.3°F | Ht 73.0 in | Wt 285.0 lb

## 2022-11-03 DIAGNOSIS — E669 Obesity, unspecified: Secondary | ICD-10-CM | POA: Diagnosis not present

## 2022-11-03 DIAGNOSIS — R7301 Impaired fasting glucose: Secondary | ICD-10-CM

## 2022-11-03 DIAGNOSIS — Z0000 Encounter for general adult medical examination without abnormal findings: Secondary | ICD-10-CM | POA: Diagnosis not present

## 2022-11-03 DIAGNOSIS — R7303 Prediabetes: Secondary | ICD-10-CM

## 2022-11-03 DIAGNOSIS — F32A Depression, unspecified: Secondary | ICD-10-CM

## 2022-11-03 DIAGNOSIS — R5383 Other fatigue: Secondary | ICD-10-CM | POA: Diagnosis not present

## 2022-11-03 DIAGNOSIS — E559 Vitamin D deficiency, unspecified: Secondary | ICD-10-CM | POA: Diagnosis not present

## 2022-11-03 DIAGNOSIS — E7849 Other hyperlipidemia: Secondary | ICD-10-CM | POA: Diagnosis not present

## 2022-11-03 DIAGNOSIS — Z6837 Body mass index (BMI) 37.0-37.9, adult: Secondary | ICD-10-CM

## 2022-11-03 DIAGNOSIS — R0602 Shortness of breath: Secondary | ICD-10-CM

## 2022-11-03 DIAGNOSIS — Z1331 Encounter for screening for depression: Secondary | ICD-10-CM

## 2022-11-04 LAB — INSULIN, RANDOM: INSULIN: 7.2 u[IU]/mL (ref 2.6–24.9)

## 2022-11-04 LAB — VITAMIN B12: Vitamin B-12: 578 pg/mL (ref 232–1245)

## 2022-11-04 LAB — VITAMIN D 25 HYDROXY (VIT D DEFICIENCY, FRACTURES): Vit D, 25-Hydroxy: 31.2 ng/mL (ref 30.0–100.0)

## 2022-11-09 ENCOUNTER — Encounter: Payer: Self-pay | Admitting: Bariatrics

## 2022-11-09 NOTE — Progress Notes (Signed)
Chief Complaint:   OBESITY [name] (MR# [id number]) is a 47 y.o. female who presents for evaluation and treatment of obesity and related comorbidities. Current BMI is Body mass index is 37.6 kg/m. Kristina Chambers has been struggling with her weight for many years and has been unsuccessful in either losing weight, maintaining weight loss, or reaching her healthy weight goal.  Kristina Chambers is currently in the action stage of change and ready to dedicate time achieving and maintaining a healthier weight. Kristina Chambers is interested in becoming our patient and working on intensive lifestyle modifications including (but not limited to) diet and exercise for weight loss.  Patient met with Judeth Cornfield, nurse practitioner on 10/12/2022.  Kristina Chambers's habits were reviewed today and are as follows: Her family eats meals together, she thinks her family will eat healthier with her, her desired weight loss is 90 lbs, she started gaining weight 5 years ago, her heaviest weight ever was 301 pounds, she has significant food cravings issues, she snacks frequently in the evenings, she skips meals frequently, she is frequently drinking liquids with calories, she frequently makes poor food choices, she frequently eats larger portions than normal, and she struggles with emotional eating.  Depression Screen Kristina Chambers's Food and Mood (modified PHQ-9) score was 9.  Subjective:   1. Other fatigue Kristina Chambers admits to daytime somnolence and admits to waking up still tired. Patient has a history of symptoms of daytime fatigue and morning fatigue. Kristina Chambers generally gets 7 hours of sleep per night, and states that she has nightime awakenings. Snoring is present. Apneic episodes are not present. Epworth Sleepiness Score is 7.   2. SOB (shortness of breath) on exertion Kristina Chambers notes increasing shortness of breath with exercising and seems to be worsening over time with weight gain. She notes getting out of breath sooner with activity than she used to.  This has not gotten worse recently. Kristina Chambers denies shortness of breath at rest or orthopnea.  3. Pre-diabetes Patient's previous A1c was 6.1.  4. Other hyperlipidemia Patient is taking lovastatin.  5. Health care maintenance Given obesity.  6. Impaired fasting glucose Patient's previous A1c was 6.1.  7. Vitamin D deficiency Patient is taking vitamin D.  Assessment/Plan:   1. Other fatigue Kristina Chambers does feel that her weight is causing her energy to be lower than it should be. Fatigue may be related to obesity, depression or many other causes. Labs will be ordered, and in the meanwhile, Kristina Chambers will focus on self care including making healthy food choices, increasing physical activity and focusing on stress reduction.  - EKG 12-Lead  2. SOB (shortness of breath) on exertion Kristina Chambers does feel that she gets out of breath more easily that she used to when she exercises. Kristina Chambers's shortness of breath appears to be obesity related and exercise induced. She has agreed to work on weight loss and gradually increase exercise to treat her exercise induced shortness of breath. Will continue to monitor closely.  3. Pre-diabetes We will check labs today, we will follow-up at her next visit.  4. Other hyperlipidemia Patient will continue her medications as directed.  5. Health care maintenance We will check labs today.  EKG and IC were done today and reviewed with the patient.  - Insulin, random - Vitamin B12 - VITAMIN D 25 Hydroxy (Vit-D Deficiency, Fractures)  6. Impaired fasting glucose We will check labs today, we will follow-up at her next visit.  7. Vitamin D deficiency We will check labs today, we will follow-up  at her next visit.  - VITAMIN D 25 Hydroxy (Vit-D Deficiency, Fractures)  8. Depression screening Kristina Chambers had a positive depression screening. Depression is commonly associated with obesity and often results in emotional eating behaviors. We will monitor this closely and work on  CBT to help improve the non-hunger eating patterns. Referral to Psychology may be required if no improvement is seen as she continues in our clinic.  9. Generalized obesity - Insulin, random - Vitamin B12 - VITAMIN D 25 Hydroxy (Vit-D Deficiency, Fractures)  10. BMI 37.0-37.9, adult Kristina Chambers is currently in the action stage of change and her goal is to continue with weight loss efforts. I recommend Kristina Chambers begin the structured treatment plan as follows:  She has agreed to the Category 4 Plan.  Meal planning was discussed.  Review labs with the patient from 09/02/2022, CMP, lipids, CBC, TSH, A1c, and T4.  Exercise goals: Patient is exercising walking the dog.  Behavioral modification strategies: increasing lean protein intake, decreasing simple carbohydrates, increasing vegetables, increasing water intake, decreasing eating out, no skipping meals, meal planning and cooking strategies, keeping healthy foods in the home, and avoiding temptations.  She was informed of the importance of frequent follow-up visits to maximize her success with intensive lifestyle modifications for her multiple health conditions. She was informed we would discuss her lab results at her next visit unless there is a critical issue that needs to be addressed sooner. Kristina Chambers agreed to keep her next visit at the agreed upon time to discuss these results.  Objective:   Blood pressure 102/71, pulse 83, temperature 98.3 F (36.8 C), height 6\' 1"  (1.854 m), weight 285 lb (129.3 kg), SpO2 99 %. Body mass index is 37.6 kg/m.  EKG: Normal sinus rhythm, rate 82 BPM.  Indirect Calorimeter completed today shows a VO2 of 391 and a REE of 2707.  Her calculated basal metabolic rate is 1610 thus her basal metabolic rate is better than expected.  General: Cooperative, alert, well developed, in no acute distress. HEENT: Conjunctivae and lids unremarkable. Cardiovascular: Regular rhythm.  Lungs: Normal work of breathing. Neurologic: No  focal deficits.   Lab Results  Component Value Date   CREATININE 0.86 09/02/2022   BUN 12 09/02/2022   NA 137 09/02/2022   K 4.1 09/02/2022   CL 103 09/02/2022   CO2 27 09/02/2022   Lab Results  Component Value Date   ALT 21 09/02/2022   AST 20 09/02/2022   ALKPHOS 89 09/02/2022   BILITOT 0.7 09/02/2022   Lab Results  Component Value Date   HGBA1C 6.1 09/02/2022   HGBA1C 6.1 05/15/2021   HGBA1C 5.8 (H) 01/15/2020   Lab Results  Component Value Date   INSULIN 7.2 11/03/2022   Lab Results  Component Value Date   TSH 2.14 09/02/2022   Lab Results  Component Value Date   CHOL 258 (H) 09/02/2022   HDL 58.70 09/02/2022   LDLCALC 172 (H) 09/02/2022   LDLDIRECT 134.0 05/15/2021   TRIG 136.0 09/02/2022   CHOLHDL 4 09/02/2022   Lab Results  Component Value Date   WBC 7.5 09/02/2022   HGB 13.8 09/02/2022   HCT 41.0 09/02/2022   MCV 86.9 09/02/2022   PLT 389.0 09/02/2022   No results found for: "IRON", "TIBC", "FERRITIN"  Attestation Statements:   Reviewed by clinician on day of visit: allergies, medications, problem list, medical history, surgical history, family history, social history, and previous encounter notes.   Trude Mcburney, am acting as Energy manager for [employer]  Manson Passey, DO.  I have reviewed the above documentation for accuracy and completeness, and I agree with the above. Corinna Capra, DO

## 2022-11-17 ENCOUNTER — Encounter: Payer: Self-pay | Admitting: Bariatrics

## 2022-11-17 ENCOUNTER — Ambulatory Visit: Payer: Federal, State, Local not specified - PPO | Admitting: Bariatrics

## 2022-11-17 VITALS — BP 104/70 | HR 78 | Temp 98.6°F | Ht 73.0 in | Wt 287.0 lb

## 2022-11-17 DIAGNOSIS — E669 Obesity, unspecified: Secondary | ICD-10-CM | POA: Diagnosis not present

## 2022-11-17 DIAGNOSIS — Z6837 Body mass index (BMI) 37.0-37.9, adult: Secondary | ICD-10-CM | POA: Diagnosis not present

## 2022-11-17 DIAGNOSIS — E7849 Other hyperlipidemia: Secondary | ICD-10-CM | POA: Diagnosis not present

## 2022-11-17 DIAGNOSIS — E559 Vitamin D deficiency, unspecified: Secondary | ICD-10-CM

## 2022-11-17 MED ORDER — VITAMIN D (ERGOCALCIFEROL) 1.25 MG (50000 UNIT) PO CAPS
50000.0000 [IU] | ORAL_CAPSULE | ORAL | 0 refills | Status: DC
Start: 2022-11-17 — End: 2022-12-23

## 2022-11-19 ENCOUNTER — Ambulatory Visit: Payer: Federal, State, Local not specified - PPO | Admitting: Family Medicine

## 2022-11-19 ENCOUNTER — Encounter: Payer: Self-pay | Admitting: Family Medicine

## 2022-11-19 VITALS — BP 112/76 | HR 82 | Temp 97.4°F | Ht 73.0 in | Wt 290.0 lb

## 2022-11-19 DIAGNOSIS — R7303 Prediabetes: Secondary | ICD-10-CM

## 2022-11-19 DIAGNOSIS — R251 Tremor, unspecified: Secondary | ICD-10-CM | POA: Insufficient documentation

## 2022-11-19 DIAGNOSIS — E7800 Pure hypercholesterolemia, unspecified: Secondary | ICD-10-CM | POA: Diagnosis not present

## 2022-11-19 LAB — COMPREHENSIVE METABOLIC PANEL
ALT: 13 U/L (ref 0–35)
AST: 16 U/L (ref 0–37)
Albumin: 4.2 g/dL (ref 3.5–5.2)
Alkaline Phosphatase: 80 U/L (ref 39–117)
BUN: 15 mg/dL (ref 6–23)
CO2: 26 mEq/L (ref 19–32)
Calcium: 9.5 mg/dL (ref 8.4–10.5)
Chloride: 104 mEq/L (ref 96–112)
Creatinine, Ser: 0.93 mg/dL (ref 0.40–1.20)
GFR: 73.08 mL/min (ref >60.00)
Glucose, Bld: 103 mg/dL — ABNORMAL HIGH (ref 70–99)
Potassium: 4.5 mEq/L (ref 3.5–5.1)
Sodium: 137 mEq/L (ref 135–145)
Total Bilirubin: 0.6 mg/dL (ref 0.2–1.2)
Total Protein: 7.6 g/dL (ref 6.0–8.3)

## 2022-11-19 LAB — CBC WITH DIFFERENTIAL/PLATELET
Basophils Absolute: 0 10*3/uL (ref 0.0–0.1)
Basophils Relative: 0.4 % (ref 0.0–3.0)
Eosinophils Absolute: 0.1 10*3/uL (ref 0.0–0.7)
Eosinophils Relative: 1 % (ref 0.0–5.0)
HCT: 41.7 % (ref 36.0–46.0)
Hemoglobin: 13.8 g/dL (ref 12.0–15.0)
Lymphocytes Relative: 22.2 % (ref 12.0–46.0)
Lymphs Abs: 2 10*3/uL (ref 0.7–4.0)
MCHC: 33.2 g/dL (ref 30.0–36.0)
MCV: 87.5 fl (ref 78.0–100.0)
Monocytes Absolute: 0.4 10*3/uL (ref 0.1–1.0)
Monocytes Relative: 4 % (ref 3.0–12.0)
Neutro Abs: 6.4 10*3/uL (ref 1.4–7.7)
Neutrophils Relative %: 72.4 % (ref 43.0–77.0)
Platelets: 370 10*3/uL (ref 150.0–400.0)
RBC: 4.76 Mil/uL (ref 3.87–5.11)
RDW: 13.6 % (ref 11.5–15.5)
WBC: 8.9 10*3/uL (ref 4.0–10.5)

## 2022-11-19 LAB — LIPID PANEL
Cholesterol: 178 mg/dL (ref 0–200)
HDL: 54 mg/dL (ref >39.00)
LDL Cholesterol: 98 mg/dL (ref 0–99)
NonHDL: 124.23
Total CHOL/HDL Ratio: 3
Triglycerides: 131 mg/dL (ref 0.0–149.0)
VLDL: 26.2 mg/dL (ref 0.0–40.0)

## 2022-11-19 LAB — MAGNESIUM: Magnesium: 2.2 mg/dL (ref 1.5–2.5)

## 2022-11-19 LAB — TSH: TSH: 1.54 u[IU]/mL (ref 0.35–5.50)

## 2022-11-19 LAB — T4, FREE: Free T4: 0.75 ng/dL (ref 0.60–1.60)

## 2022-11-19 NOTE — Progress Notes (Signed)
Subjective:     Patient ID: Kristina Chambers, female    DOB: 1975-07-25, 47 y.o.   MRN: [id number]  Chief Complaint  Patient presents with   finger problem    Left thumb shaking happens all throughout the day, started 4 weeks ago    HPI  Discussed the use of AI scribe software for clinical note transcription with the patient, who gave verbal consent to proceed.  History of Present Illness         C/o 4 wk hx of left thumb tremor. Occurring too often to count throughout the day and lasts approximately 30 seconds. States other people have noticed the tremor. Denies any new medications.   Right handed.  She types a lot and does yard work.   Denies fever, chills, dizziness, headache, neck pain, back pain, chest pain, palpitations, shortness of breath, abdominal pain, N/V/D, urinary symptoms, LE edema.   Denies left arm pain, numbness, tingling or pain of left hand.    There are no preventive care reminders to display for this patient.  Past Medical History:  Diagnosis Date   Alcohol abuse    Anxiety    Chicken pox    Depression    Elevated cholesterol 2020   Fatty liver    GERD (gastroesophageal reflux disease) 2023   High cholesterol    HPV in female    Lichen sclerosus 2021   Severe dysplasia of cervix (CIN III) 2021   SOB (shortness of breath)    Substance abuse (HCC)     Past Surgical History:  Procedure Laterality Date   CLAVICLE SURGERY     FRACTURE SURGERY  2010   GYNECOLOGIC CRYOSURGERY     LEEP  03/11/2020   CIN II, margins positive for CIN I   meniscus right      Family History  Problem Relation Age of Onset   Hypertension Mother    Colon polyps Mother    Depression Mother    Lupus Maternal Grandmother    Colon cancer Paternal Grandfather    Stomach cancer Neg Hx    Esophageal cancer Neg Hx    Breast cancer Neg Hx     Social History   Socioeconomic History   Marital status: Single    Spouse name: Not on file   Number of children: Not on  file   Years of education: Not on file   Highest education level: Bachelor's degree (e.g., BA, AB, BS)  Occupational History   Not on file  Tobacco Use   Smoking status: Former    Types: Cigarettes    Quit date: 06/14/2008    Years since quitting: 14.4   Smokeless tobacco: Never  Vaping Use   Vaping Use: Never used  Substance and Sexual Activity   Alcohol use: Not Currently   Drug use: Never   Sexual activity: Not Currently    Birth control/protection: I.U.D.    Comment: Mirena insertion summer of 2017  Other Topics Concern   Not on file  Social History Narrative   Not on file   Social Determinants of Health   Financial Resource Strain: Low Risk  (11/18/2022)   Overall Financial Resource Strain (CARDIA)    Difficulty of Paying Living Expenses: Not hard at all  Food Insecurity: No Food Insecurity (11/18/2022)   Hunger Vital Sign    Worried About Running Out of Food in the Last Year: Never true    Ran Out of Food in the Last Year: Never true  Transportation Needs: No Transportation Needs (11/18/2022)   PRAPARE - Administrator, Civil Service (Medical): No    Lack of Transportation (Non-Medical): No  Physical Activity: Insufficiently Active (11/18/2022)   Exercise Vital Sign    Days of Exercise per Week: 3 days    Minutes of Exercise per Session: 40 min  Stress: No Stress Concern Present (11/18/2022)   [employer] of Occupational Health - Occupational Stress Questionnaire    Feeling of Stress : Only a little  Social Connections: Moderately Integrated (11/18/2022)   Social Connection and Isolation Panel [NHANES]    Frequency of Communication with Friends and Family: More than three times a week    Frequency of Social Gatherings with Friends and Family: Once a week    Attends Religious Services: More than 4 times per year    Active Member of [employer] or Organizations: Yes    Attends Engineer, structural: More than 4 times per year    Marital Status: Divorced   Catering manager Violence: Not on file    Outpatient Medications Prior to Visit  Medication Sig Dispense Refill   ARIPiprazole (ABILIFY) 15 MG tablet Take one tablet daily. 90 tablet 3   buPROPion (WELLBUTRIN XL) 150 MG 24 hr tablet Take 1 tablet (150 mg total) by mouth daily. 90 tablet 3   buPROPion (WELLBUTRIN XL) 300 MG 24 hr tablet TAKE 1 TABLET BY MOUTH EVERY MORNING 90 tablet 3   clobetasol ointment (TEMOVATE) 0.05 % Apply to area in a thin layer bid for 2 weeks as needed.  May apply once daily at night time, twice a week for maintenance dose. 30 g 1   escitalopram (LEXAPRO) 10 MG tablet Take 1 tablet (10 mg total) by mouth daily. 90 tablet 3   lamoTRIgine (LAMICTAL) 150 MG tablet Take 1 tablet (150 mg total) by mouth daily. 90 tablet 3   levonorgestrel (MIRENA) 20 MCG/24HR IUD by Intrauterine route.     lovastatin (MEVACOR) 40 MG tablet Take 1 tablet (40 mg total) by mouth at bedtime. 90 tablet 0   Vitamin D, Ergocalciferol, (DRISDOL) 1.25 MG (50000 UNIT) CAPS capsule Take 1 capsule (50,000 Units total) by mouth every 7 (seven) days. 5 capsule 0   No facility-administered medications prior to visit.    No Known Allergies  ROS     Objective:    Physical Exam Constitutional:      General: She is not in acute distress.    Appearance: She is not ill-appearing.  HENT:     Mouth/Throat:     Mouth: Mucous membranes are moist.     Pharynx: Oropharynx is clear.  Eyes:     Extraocular Movements: Extraocular movements intact.     Conjunctiva/sclera: Conjunctivae normal.  Cardiovascular:     Rate and Rhythm: Normal rate.  Pulmonary:     Effort: Pulmonary effort is normal.  Musculoskeletal:        General: No swelling, tenderness or deformity.     Left wrist: Normal.     Left hand: No swelling, tenderness or bony tenderness. Normal range of motion. Normal strength. Normal sensation. Normal capillary refill. Normal pulse.     Cervical back: Normal range of motion and neck  supple.  Skin:    General: Skin is warm and dry.     Findings: No bruising, erythema or rash.  Neurological:     General: No focal deficit present.     Mental Status: She is alert and oriented to person,  place, and time.     Cranial Nerves: No facial asymmetry.     Sensory: Sensation is intact.     Motor: Tremor present. No weakness.     Coordination: Coordination normal.  Psychiatric:        Mood and Affect: Mood normal.        Behavior: Behavior normal.        Thought Content: Thought content normal.      BP 112/76 (BP Location: Left Arm, Patient Position: Sitting, Cuff Size: Large)   Pulse 82   Temp (!) 97.4 F (36.3 C) (Temporal)   Ht 6\' 1"  (1.854 m)   Wt 290 lb (131.5 kg)   SpO2 98%   BMI 38.26 kg/m  Wt Readings from Last 3 Encounters:  11/19/22 290 lb (131.5 kg)  11/17/22 287 lb (130.2 kg)  11/03/22 285 lb (129.3 kg)       Assessment & Plan:   Problem List Items Addressed This Visit       Other   Hyperlipidemia    Increased lovastatin to 40 mg. Recheck lipids.       Relevant Orders   Lipid panel   Prediabetes   Relevant Orders   Comprehensive metabolic panel   Tremor of left hand - Primary   Relevant Orders   Lipid panel   CBC with Differential/Platelet   Comprehensive metabolic panel   TSH   T4, free   Magnesium   Ambulatory referral to Neurology   Benign exam except for tremor of left foot which occurred while in the office. Tremor occurs at rest or with action.  Check labs to look for underlying etiologies. Referral to neurologist.  Follow up pending results.   I am having Kristina Chambers maintain her levonorgestrel, lovastatin, lamoTRIgine, escitalopram, buPROPion, buPROPion, ARIPiprazole, clobetasol ointment, and Vitamin D (Ergocalciferol).  No orders of the defined types were placed in this encounter.

## 2022-11-19 NOTE — Assessment & Plan Note (Signed)
Increased lovastatin to 40 mg. Recheck lipids.

## 2022-11-22 NOTE — Progress Notes (Signed)
Chief Complaint:   OBESITY Kristina Chambers is here to discuss her progress with her obesity treatment plan along with follow-up of her obesity related diagnoses. Kristina Chambers is on the Category 4 Plan and states she is following her eating plan approximately 60% of the time. Kristina Chambers states she is HIT workout and walking for 45 minutes 2-3 times per week.  Today's visit was #: 2 Starting weight: 285 lbs Starting date: 11/03/2022 Today's weight: 287 lbs Today's date: 11/17/2022 Total lbs lost to date: 0 Total lbs lost since last in-office visit: 0  Interim History: Patient is up 2 pounds since her first visit.  She states things have been hectic.  She is eating less in the morning and eating in the p.m.  She is doing well with her water.  Subjective:   1. Vitamin D insufficiency Patient is not on vitamin D, and her recent vitamin D level was 31.2.  2. Other hyperlipidemia Patient is taking Mevacor, and not well-controlled.  Assessment/Plan:   1. Vitamin D insufficiency Patient agreed to start prescription vitamin D 50,000 IU once weekly with no refills.  - Vitamin D, Ergocalciferol, (DRISDOL) 1.25 MG (50000 UNIT) CAPS capsule; Take 1 capsule (50,000 Units total) by mouth every 7 (seven) days.  Dispense: 5 capsule; Refill: 0  2. Other hyperlipidemia Patient is to work on eliminating trans fats and minimize saturated fats.  She will continue her medications, and she agreed to increase Mevacor from 20 mg to 40 mg.  Healthy versus unhealthy fats and eating out handout were given.  3. Generalized obesity  4. BMI 37.0-37.9, adult Kristina Chambers is currently in the action stage of change. As such, her goal is to continue with weight loss efforts. She has agreed to the Category 4 Plan.   Meal planning on Sundays was discussed.  Review labs with the patient from 11/03/2022, vitamin D, insulin, B12.  Patient will go shopping and increase her protein intake.  Exercise goals: No exercise has been prescribed at  this time.  Behavioral modification strategies: increasing lean protein intake, decreasing simple carbohydrates, increasing vegetables, increasing water intake, decreasing eating out, no skipping meals, meal planning and cooking strategies, keeping healthy foods in the home, and planning for success.  Dave has agreed to follow-up with our clinic in 2 weeks. She was informed of the importance of frequent follow-up visits to maximize her success with intensive lifestyle modifications for her multiple health conditions.   Objective:   Blood pressure 104/70, pulse 78, temperature 98.6 F (37 C), height 6\' 1"  (1.854 m), weight 287 lb (130.2 kg), SpO2 98 %. Body mass index is 37.87 kg/m.  General: Cooperative, alert, well developed, in no acute distress. HEENT: Conjunctivae and lids unremarkable. Cardiovascular: Regular rhythm.  Lungs: Normal work of breathing. Neurologic: No focal deficits.   Lab Results  Component Value Date   CREATININE 0.93 11/19/2022   BUN 15 11/19/2022   NA 137 11/19/2022   K 4.5 11/19/2022   CL 104 11/19/2022   CO2 26 11/19/2022   Lab Results  Component Value Date   ALT 13 11/19/2022   AST 16 11/19/2022   ALKPHOS 80 11/19/2022   BILITOT 0.6 11/19/2022   Lab Results  Component Value Date   HGBA1C 6.1 09/02/2022   HGBA1C 6.1 05/15/2021   HGBA1C 5.8 (H) 01/15/2020   Lab Results  Component Value Date   INSULIN 7.2 11/03/2022   Lab Results  Component Value Date   TSH 1.54 11/19/2022   Lab Results  Component Value Date   CHOL 178 11/19/2022   HDL 54.00 11/19/2022   LDLCALC 98 11/19/2022   LDLDIRECT 134.0 05/15/2021   TRIG 131.0 11/19/2022   CHOLHDL 3 11/19/2022   Lab Results  Component Value Date   VD25OH 31.2 11/03/2022   Lab Results  Component Value Date   WBC 8.9 11/19/2022   HGB 13.8 11/19/2022   HCT 41.7 11/19/2022   MCV 87.5 11/19/2022   PLT 370.0 11/19/2022   No results found for: "IRON", "TIBC", "FERRITIN"  Attestation  Statements:   Reviewed by clinician on day of visit: allergies, medications, problem list, medical history, surgical history, family history, social history, and previous encounter notes.   Trude Mcburney, am acting as Energy manager for [employer], DO.  I have reviewed the above documentation for accuracy and completeness, and I agree with the above. Corinna Capra, DO

## 2022-11-28 ENCOUNTER — Other Ambulatory Visit: Payer: Self-pay | Admitting: Family Medicine

## 2022-12-09 ENCOUNTER — Ambulatory Visit: Payer: Federal, State, Local not specified - PPO | Admitting: Nurse Practitioner

## 2022-12-15 ENCOUNTER — Other Ambulatory Visit: Payer: Self-pay | Admitting: Family Medicine

## 2022-12-15 DIAGNOSIS — E785 Hyperlipidemia, unspecified: Secondary | ICD-10-CM

## 2022-12-18 ENCOUNTER — Other Ambulatory Visit: Payer: Self-pay | Admitting: Bariatrics

## 2022-12-18 DIAGNOSIS — E559 Vitamin D deficiency, unspecified: Secondary | ICD-10-CM

## 2022-12-23 ENCOUNTER — Encounter: Payer: Self-pay | Admitting: Bariatrics

## 2022-12-23 ENCOUNTER — Ambulatory Visit: Payer: Federal, State, Local not specified - PPO | Admitting: Bariatrics

## 2022-12-23 VITALS — BP 112/72 | HR 71 | Temp 98.7°F | Ht 73.0 in | Wt 290.0 lb

## 2022-12-23 DIAGNOSIS — E669 Obesity, unspecified: Secondary | ICD-10-CM | POA: Diagnosis not present

## 2022-12-23 DIAGNOSIS — R632 Polyphagia: Secondary | ICD-10-CM | POA: Diagnosis not present

## 2022-12-23 DIAGNOSIS — E7849 Other hyperlipidemia: Secondary | ICD-10-CM

## 2022-12-23 DIAGNOSIS — E559 Vitamin D deficiency, unspecified: Secondary | ICD-10-CM

## 2022-12-23 DIAGNOSIS — Z6838 Body mass index (BMI) 38.0-38.9, adult: Secondary | ICD-10-CM

## 2022-12-23 MED ORDER — QSYMIA 3.75-23 MG PO CP24: ORAL_CAPSULE | ORAL | 0 refills | Status: DC

## 2022-12-23 MED ORDER — QSYMIA 7.5-46 MG PO CP24: ORAL_CAPSULE | ORAL | 0 refills | Status: DC

## 2022-12-23 MED ORDER — VITAMIN D (ERGOCALCIFEROL) 1.25 MG (50000 UNIT) PO CAPS
50000.0000 [IU] | ORAL_CAPSULE | ORAL | 0 refills | Status: DC
Start: 2022-12-23 — End: 2023-09-12

## 2022-12-23 NOTE — Progress Notes (Signed)
WEIGHT SUMMARY AND BIOMETRICS  Weight Gained Since Last Visit: 3lb   Vitals Temp: 98.7 F (37.1 C) BP: 112/72 Pulse Rate: 71 SpO2: 98 %   Anthropometric Measurements Height: 6\' 1"  (1.854 m) Weight: 290 lb (131.5 kg) BMI (Calculated): 38.27 Weight at Last Visit: 287lb Weight Gained Since Last Visit: 3lb Total Weight Loss (lbs): 0 lb (0 kg)   Body Composition  Body Fat %: 46.6 % Fat Mass (lbs): 135.2 lbs Muscle Mass (lbs): 147.2 lbs Total Body Water (lbs): 105.6 lbs Visceral Fat Rating : 13   Other Clinical Data Fasting: no Labs: no Today's Visit #: 3 Starting Date: 11/03/22    OBESITY Kristina Chambers is here to discuss her progress with her obesity treatment plan along with follow-up of her obesity related diagnoses.     Nutrition Plan: the Category 4 plan - 50% adherence.  Current exercise:  Group workouts.  Interim History:  She is up 3 lbs since the last visit. She is not eating enough during the day.  Eating all of the food on the plan., Protein intake is as prescribed, Is skipping meals, and Water intake is adequate.  Pharmacotherapy:  Hunger is poorly controlled.  Cravings are poorly controlled.  Assessment/Plan:   1. Vitamin D insufficiency  Vitamin D is not at goal of 50.  Most recent vitamin D level was 31.2. She is on  prescription ergocalciferol 50,000 IU weekly. Lab Results  Component Value Date   VD25OH 31.2 11/03/2022    Plan: Refill prescription vitamin D 50,000 IU weekly.   Polyphagia Kristina Chambers endorses excessive hunger.  Medication(s): none  Appetite: poorly controlled. Cravings are poorly controlled.   Plan: Medication(s): Qsymia 7.5/46 mg 1 capsule by mouth daily in am Will start Qsymia today. Checked the PDMP, and controlled medication sheet for Phentermine/Qsymia reviewed and signed with the patient. She denies contraindications. Benefits and risks were discussed.   Rx: Qsymia 3.75/23 mg capsule 1 daily #30 with no  refills. Qsymia 7.5/46 mg 1 daily # 30 with no refills. Sent to mail order/local pharmacy.   Given written information for [facility].  Will increase water, protein and fiber to help assuage hunger.  Will minimize foods that have a high glucose index/load to minimize reactive hypoglycemia.    Generalized Obesity: Current BMI BMI (Calculated): 38.27    She will start to weight her meat for lunch and dinner. She will not skip meals.      Kristina Chambers is currently in the action stage of change. As such, her goal is to continue with weight loss efforts.  She has agreed to the Category 4 plan.  Exercise goals: For substantial health benefits, adults should do at least 150 minutes (2 hours and 30 minutes) a week of moderate-intensity, or 75 minutes (1 hour and 15 minutes) a week of vigorous-intensity aerobic physical activity, or an equivalent combination of moderate- and vigorous-intensity aerobic activity. Aerobic activity should be performed in episodes of at least 10 minutes, and preferably, it should be spread throughout the week.  Behavioral modification strategies: increasing lean protein intake, decreasing simple carbohydrates , no meal skipping, meal planning , planning for success, decrease snacking , avoiding temptations, and keep healthy foods in the home.  Kristina Chambers has agreed to follow-up with our clinic in 3 weeks.      Objective:   VITALS: Per patient if applicable, see vitals. GENERAL: Alert and in no acute distress. CARDIOPULMONARY: No increased WOB. Speaking in clear sentences.  PSYCH: Pleasant and cooperative. Speech normal rate  and rhythm. Affect is appropriate. Insight and judgement are appropriate. Attention is focused, linear, and appropriate.  NEURO: Oriented as arrived to appointment on time with no prompting.   Attestation Statements:   This was prepared with the assistance of Engineer, civil (consulting).  Occasional wrong-word or sound-a-like substitutions may have occurred due  to the inherent limitations of voice recognition software.   Corinna Capra, DO

## 2022-12-27 ENCOUNTER — Telehealth: Payer: Self-pay

## 2022-12-27 NOTE — Telephone Encounter (Signed)
Started prior authorization for Qsymia via covermymeds.

## 2023-01-05 NOTE — Progress Notes (Deleted)
  TeleHealth Visit:  This visit was completed with telemedicine (audio/video) technology. Kristina Chambers has verbally consented to this TeleHealth visit. The patient is located at home, the provider is located at home. The participants in this visit include the listed provider and patient. The visit was conducted today via MyChart video.  OBESITY Toy is here to discuss her progress with her obesity treatment plan along with follow-up of her obesity related diagnoses.   Today's visit was # 4 Starting weight: 285 lbs Starting date: 11/03/22 Weight at last in office visit: 290 lbs on 12/23/22 Total weight loss: 0 lbs at last in office visit on 12/23/22. Today's reported weight (01/06/23): {dwwweightreported:29243}  Nutrition Plan: the Category 4 plan - ***% adherence.  Current exercise: {exercise types:16438} group workouts  Interim History:  *** {dwwnutritionassessment:28854}  Eating all of the prescribed protein: {yes***/no:17258} Skipping meals: {dwwyes:29172} Drinking adequate water: {dwwyes:29172} Drinking sugar sweetened beverages: {dwwyes:29172} Hunger controlled: {EWCONTROLASSESSMENT:24261}. Cravings controlled:  {EWCONTROLASSESSMENT:24261}.  Journaling Consistently:  {dwwyes:29172} Meeting protein goals:  {dwwyes:29172} Meeting calorie goals:  {dwwyes:29172}   Pharmacotherapy: Kristina Chambers is on {dwwpharmacotherapy:29109} Adverse side effects: {dwwse:29122} Hunger is {EWCONTROLASSESSMENT:24261}.  Cravings are {EWCONTROLASSESSMENT:24261}.  Assessment/Plan:  1. ***  2. ***  3. ***  {dwwmorbid:29108::"Morbid Obesity"}: Current BMI ***  Pharmacotherapy Plan {dwwmed:29123}  {dwwpharmacotherapy:29109}  Kristina Chambers {CHL AMB IS/IS Chambers:[id number]} currently in the action stage of change. As such, her goal is to {MWMwtloss#1:210800005}.  She has agreed to {dwwsldiets:29085}.  Exercise goals: {MWM EXERCISE RECS:23473}  Behavioral modification strategies:  {dwwslwtlossstrategies:29088}.  Kristina Chambers has agreed to follow-up with our clinic in {NUMBER 1-10:22536} {dwwfutime:29619}  No orders of the defined types were placed in this encounter.   There are no discontinued medications.   No orders of the defined types were placed in this encounter.     Objective:   VITALS: Per patient if applicable, see vitals. GENERAL: Alert and in no acute distress. CARDIOPULMONARY: No increased WOB. Speaking in clear sentences.  PSYCH: Pleasant and cooperative. Speech normal rate and rhythm. Affect is appropriate. Insight and judgement are appropriate. Attention is focused, linear, and appropriate.  NEURO: Oriented as arrived to appointment on time with no prompting.   Attestation Statements:   Reviewed by clinician on day of visit: allergies, medications, problem list, medical history, surgical history, family history, social history, and previous encounter notes.  ***(delete if time-based billing Chambers used) Time spent on visit including the items listed below was *** minutes.  -preparing to see the patient (e.g., review of tests, history, previous notes) -obtaining and/or reviewing separately obtained history -counseling and educating the patient/family/caregiver -documenting clinical information in the electronic or other health record -ordering medications, tests, or procedures -independently interpreting results and communicating results to the patient/ family/caregiver -referring and communicating with other health care professionals  -care coordination   This was prepared with the assistance of Engineer, civil (consulting).  Occasional wrong-word or sound-a-like substitutions may have occurred due to the inherent limitations of voice recognition software.

## 2023-01-06 ENCOUNTER — Telehealth (INDEPENDENT_AMBULATORY_CARE_PROVIDER_SITE_OTHER): Payer: Federal, State, Local not specified - PPO | Admitting: Family Medicine

## 2023-01-06 ENCOUNTER — Telehealth (INDEPENDENT_AMBULATORY_CARE_PROVIDER_SITE_OTHER): Payer: Self-pay | Admitting: Family Medicine

## 2023-01-06 DIAGNOSIS — Z6838 Body mass index (BMI) 38.0-38.9, adult: Secondary | ICD-10-CM

## 2023-01-06 DIAGNOSIS — E669 Obesity, unspecified: Secondary | ICD-10-CM

## 2023-01-06 NOTE — Telephone Encounter (Signed)
Patient was a no-show for virtual visit.  Waited on the virtual visit until 6 minutes past appointment time per office policy.  Left VM advising patient to call to reschedule.  

## 2023-01-24 ENCOUNTER — Ambulatory Visit: Payer: Federal, State, Local not specified - PPO | Admitting: Bariatrics

## 2023-02-10 ENCOUNTER — Ambulatory Visit: Payer: Federal, State, Local not specified - PPO | Admitting: Bariatrics

## 2023-02-15 ENCOUNTER — Ambulatory Visit: Payer: Federal, State, Local not specified - PPO | Admitting: Bariatrics

## 2023-02-15 DIAGNOSIS — M25562 Pain in left knee: Secondary | ICD-10-CM | POA: Diagnosis not present

## 2023-02-23 DIAGNOSIS — M25562 Pain in left knee: Secondary | ICD-10-CM | POA: Diagnosis not present

## 2023-02-28 ENCOUNTER — Ambulatory Visit: Payer: Federal, State, Local not specified - PPO | Admitting: Neurology

## 2023-02-28 ENCOUNTER — Encounter: Payer: Self-pay | Admitting: Neurology

## 2023-02-28 VITALS — BP 121/82 | HR 87 | Ht 73.0 in | Wt 298.5 lb

## 2023-02-28 DIAGNOSIS — R251 Tremor, unspecified: Secondary | ICD-10-CM

## 2023-02-28 NOTE — Progress Notes (Signed)
Chief Complaint  Patient presents with   New Patient (Initial Visit)    Rm12, alone, internal referral for tremor: left hand mainly in the thumb (started 6 months ago) constant/worse at times       ASSESSMENT AND PLAN  Kristina Chambers is a 47 y.o. female   Intermittent left hand tremor since beginning of 2024,  Normal neurological examination other than intermittent left arm tremor  In specific, no rigidity, no bradykinesia, no right hand involvement,  Differentiation diagnosis including medication side effect, asymmetric essential tremor,  Normal thyroid functional test,  Will continue observe symptoms, return to clinic in 1 year   DIAGNOSTIC DATA (LABS, IMAGING, TESTING) - I reviewed patient records, labs, notes, testing and imaging myself where available.   MEDICAL HISTORY:  Kristina Chambers is 47 year old right-handed female, seen in request by her primary care nurse practitioner Hetty Blend for evaluation of left thumb, hand tremor, initial evaluation was February 28, 2023   I reviewed and summarized the referring note. PMHX Depression, anxiety HLD Fatty liver History of Alcohol abuse stop in 2020  She noticed gradual onset of left hand tremor since 2024 mainly having left thumb, sometimes spreading to whole left hand, no weakness, no limitation of her daily function, no right hand involvement, no trigger noted, no family history of such,  Laboratory evaluation in June 2024, normal TSH, free T4, CBC, lipid panel, vitamin D, B12, CMP  PHYSICAL EXAM:   Vitals:   02/28/23 0834  BP: 121/82  Pulse: 87  Weight: 298 lb 8 oz (135.4 kg)  Height: 6\' 1"  (1.854 m)   Not recorded     Body mass index is 39.38 kg/m.  PHYSICAL EXAMNIATION:  Gen: NAD, conversant, well nourised, well groomed                     Cardiovascular: Regular rate rhythm, no peripheral edema, warm, nontender. Eyes: Conjunctivae clear without exudates or hemorrhage Neck: Supple, no carotid  bruits. Pulmonary: Clear to auscultation bilaterally   NEUROLOGICAL EXAM:  MENTAL STATUS: Speech/cognition: Awake, alert, oriented to history taking and casual conversation CRANIAL NERVES: CN II: Visual fields are full to confrontation. Pupils are round equal and briskly reactive to light. CN III, IV, VI: extraocular movement are normal. No ptosis. CN V: Facial sensation is intact to light touch CN VII: Face is symmetric with normal eye closure  CN VIII: Hearing is normal to causal conversation. CN IX, X: Phonation is normal. CN XI: Head turning and shoulder shrug are intact  MOTOR: Intermittent left forearm posturing tremor, no rigidity, no bradykinesia  REFLEXES: Reflexes are 2+ and symmetric at the biceps, triceps, knees, and ankles. Plantar responses are flexor.  SENSORY: Intact to light touch, pinprick and vibratory sensation are intact in fingers and toes.  COORDINATION: There is no trunk or limb dysmetria noted.  GAIT/STANCE: Posture is normal. Gait is steady with normal steps, base, arm swing, and turning. Heel and toe walking are normal. Tandem gait is normal.  Romberg is absent.  REVIEW OF SYSTEMS:  Full 14 system review of systems performed and notable only for as above All other review of systems were negative.   ALLERGIES: No Known Allergies  HOME MEDICATIONS: Current Outpatient Medications  Medication Sig Dispense Refill   ARIPiprazole (ABILIFY) 15 MG tablet Take one tablet daily. 90 tablet 3   buPROPion (WELLBUTRIN XL) 150 MG 24 hr tablet Take 1 tablet (150 mg total) by mouth daily. 90 tablet 3  buPROPion (WELLBUTRIN XL) 300 MG 24 hr tablet TAKE 1 TABLET BY MOUTH EVERY MORNING 90 tablet 3   clobetasol ointment (TEMOVATE) 0.05 % Apply to area in a thin layer bid for 2 weeks as needed.  May apply once daily at night time, twice a week for maintenance dose. 30 g 1   escitalopram (LEXAPRO) 10 MG tablet Take 1 tablet (10 mg total) by mouth daily. 90 tablet 3    lamoTRIgine (LAMICTAL) 150 MG tablet Take 1 tablet (150 mg total) by mouth daily. 90 tablet 3   levonorgestrel (MIRENA) 20 MCG/24HR IUD by Intrauterine route.     lovastatin (MEVACOR) 40 MG tablet TAKE 1 TABLET BY MOUTH EVERYDAY AT BEDTIME 90 tablet 0   Phentermine-Topiramate (QSYMIA) 7.5-46 MG CP24 Take 1 capsule daily 30 capsule 0   Vitamin D, Ergocalciferol, (DRISDOL) 1.25 MG (50000 UNIT) CAPS capsule Take 1 capsule (50,000 Units total) by mouth every 7 (seven) days. 5 capsule 0   No current facility-administered medications for this visit.    PAST MEDICAL HISTORY: Past Medical History:  Diagnosis Date   Alcohol abuse    Anxiety    Chicken pox    Depression    Elevated cholesterol 2020   Fatty liver    GERD (gastroesophageal reflux disease) 2023   High cholesterol    HPV in female    Lichen sclerosus 2021   Severe dysplasia of cervix (CIN III) 2021   SOB (shortness of breath)    Substance abuse (HCC)     PAST SURGICAL HISTORY: Past Surgical History:  Procedure Laterality Date   CLAVICLE SURGERY     FRACTURE SURGERY  2010   GYNECOLOGIC CRYOSURGERY     LEEP  03/11/2020   CIN II, margins positive for CIN I   meniscus right      FAMILY HISTORY: Family History  Problem Relation Age of Onset   Hypertension Mother    Colon polyps Mother    Depression Mother    Lupus Maternal Grandmother    Colon cancer Paternal Grandfather    Stomach cancer Neg Hx    Esophageal cancer Neg Hx    Breast cancer Neg Hx     SOCIAL HISTORY: Social History   Socioeconomic History   Marital status: Divorced    Spouse name: Not on file   Number of children: 1   Years of education: Not on file   Highest education level: Bachelor's degree (e.g., BA, AB, BS)  Occupational History   Not on file  Tobacco Use   Smoking status: Former    Current packs/day: 0.00    Types: Cigarettes    Quit date: 06/14/2008    Years since quitting: 14.7   Smokeless tobacco: Never  Vaping Use   Vaping  status: Never Used  Substance and Sexual Activity   Alcohol use: Not Currently   Drug use: Never   Sexual activity: Not Currently    Birth control/protection: I.U.D.    Comment: Mirena insertion summer of 2017  Other Topics Concern   Not on file  Social History Narrative   Not on file   Social Determinants of Health   Financial Resource Strain: Low Risk  (11/18/2022)   Overall Financial Resource Strain (CARDIA)    Difficulty of Paying Living Expenses: Not hard at all  Food Insecurity: No Food Insecurity (11/18/2022)   Hunger Vital Sign    Worried About Running Out of Food in the Last Year: Never true    Ran Out of Food  in the Last Year: Never true  Transportation Needs: No Transportation Needs (11/18/2022)   PRAPARE - Administrator, Civil Service (Medical): No    Lack of Transportation (Non-Medical): No  Physical Activity: Insufficiently Active (11/18/2022)   Exercise Vital Sign    Days of Exercise per Week: 3 days    Minutes of Exercise per Session: 40 min  Stress: No Stress Concern Present (11/18/2022)   [employer] of Occupational Health - Occupational Stress Questionnaire    Feeling of Stress : Only a little  Social Connections: Moderately Integrated (11/18/2022)   Social Connection and Isolation Panel [NHANES]    Frequency of Communication with Friends and Family: More than three times a week    Frequency of Social Gatherings with Friends and Family: Once a week    Attends Religious Services: More than 4 times per year    Active Member of [employer] or Organizations: Yes    Attends Engineer, structural: More than 4 times per year    Marital Status: Divorced  Intimate Partner Violence: Not on file      Levert Feinstein, M.D. Ph.D.  [facility] [address] [place], [place] [address] Ph: [phone] Fax: [phone]  CC:  Avanell Shackleton, NP-C [address] [place],  [place] [address]  Avanell Shackleton, NP-C

## 2023-03-02 ENCOUNTER — Ambulatory Visit: Payer: Federal, State, Local not specified - PPO | Admitting: Bariatrics

## 2023-03-10 DIAGNOSIS — M25562 Pain in left knee: Secondary | ICD-10-CM | POA: Diagnosis not present

## 2023-03-22 ENCOUNTER — Other Ambulatory Visit: Payer: Self-pay | Admitting: Family Medicine

## 2023-03-22 DIAGNOSIS — E785 Hyperlipidemia, unspecified: Secondary | ICD-10-CM

## 2023-04-01 ENCOUNTER — Ambulatory Visit: Payer: Federal, State, Local not specified - PPO | Admitting: Adult Health

## 2023-04-14 ENCOUNTER — Other Ambulatory Visit: Payer: Self-pay | Admitting: Obstetrics and Gynecology

## 2023-04-14 DIAGNOSIS — M25562 Pain in left knee: Secondary | ICD-10-CM | POA: Diagnosis not present

## 2023-04-14 DIAGNOSIS — Z308 Encounter for other contraceptive management: Secondary | ICD-10-CM

## 2023-06-10 DIAGNOSIS — M25562 Pain in left knee: Secondary | ICD-10-CM | POA: Diagnosis not present

## 2023-06-26 ENCOUNTER — Other Ambulatory Visit: Payer: Self-pay | Admitting: Family Medicine

## 2023-06-26 DIAGNOSIS — E785 Hyperlipidemia, unspecified: Secondary | ICD-10-CM

## 2023-07-13 ENCOUNTER — Other Ambulatory Visit: Payer: Self-pay | Admitting: Obstetrics and Gynecology

## 2023-07-13 DIAGNOSIS — Z1231 Encounter for screening mammogram for malignant neoplasm of breast: Secondary | ICD-10-CM

## 2023-08-01 ENCOUNTER — Ambulatory Visit
Admission: RE | Admit: 2023-08-01 | Discharge: 2023-08-01 | Disposition: A | Discharge status: Home / Self Care | Payer: Federal, State, Local not specified - PPO | Source: Ambulatory Visit | Attending: Obstetrics and Gynecology | Admitting: Obstetrics and Gynecology

## 2023-08-01 DIAGNOSIS — Z1231 Encounter for screening mammogram for malignant neoplasm of breast: Secondary | ICD-10-CM | POA: Diagnosis not present

## 2023-08-05 ENCOUNTER — Encounter: Payer: Self-pay | Admitting: Obstetrics and Gynecology

## 2023-09-05 ENCOUNTER — Ambulatory Visit: Payer: Federal, State, Local not specified - PPO | Admitting: Obstetrics and Gynecology

## 2023-09-12 ENCOUNTER — Encounter: Payer: Self-pay | Admitting: Obstetrics and Gynecology

## 2023-09-12 ENCOUNTER — Ambulatory Visit (INDEPENDENT_AMBULATORY_CARE_PROVIDER_SITE_OTHER): Payer: Federal, State, Local not specified - PPO | Admitting: Obstetrics and Gynecology

## 2023-09-12 VITALS — BP 128/76 | HR 83 | Ht 73.0 in | Wt 306.0 lb

## 2023-09-12 DIAGNOSIS — Z01812 Encounter for preprocedural laboratory examination: Secondary | ICD-10-CM | POA: Diagnosis not present

## 2023-09-12 DIAGNOSIS — Z3009 Encounter for other general counseling and advice on contraception: Secondary | ICD-10-CM

## 2023-09-12 DIAGNOSIS — Z30433 Encounter for removal and reinsertion of intrauterine contraceptive device: Secondary | ICD-10-CM

## 2023-09-12 LAB — PREGNANCY, URINE: Preg Test, Ur: NEGATIVE

## 2023-09-12 MED ORDER — LEVONORGESTREL 20 MCG/DAY IU IUD
1.0000 | INTRAUTERINE_SYSTEM | Freq: Once | INTRAUTERINE | Status: AC
  Administered 2023-09-12: 1 via INTRAUTERINE

## 2023-09-12 NOTE — Patient Instructions (Signed)
 Intrauterine Device (IUD) Insertion: What to Expect  An intrauterine device (IUD) is put in (inserted) your uterus to prevent pregnancy. It's a small, T-shaped device that has one or two nylon strings hanging down from it. The strings hang out of your cervix, which is the lowest part of your uterus. Tell a health care provider about: Any allergies you have. All medicines you take. These include vitamins, herbs, eye drops, and creams. Any surgeries you have had. Any medical problems you have. Whether you're pregnant or may be pregnant. What are the risks? Your health care provider will talk with you about risks. These may include: Infection. Bleeding. Allergic reactions to medicines. A cut to the uterus, also called perforation, or damage to other structures or organs. Accidental placement of the IUD either in the muscle layer of the uterus or outside the uterus. The IUD falling out of the uterus. This is more common if you recently had a baby. Higher risk of ectopic pregnancy. This is when an egg is fertilized outside your uterus. This is rare. Pelvic inflammatory disease (PID). This is an infection in the uterus and fallopian tubes. The IUD doesn't cause the infection. The infection is usually from a sexually transmitted infection (STI). If this happens, it is usually during the first 20 days after the IUD is put in. This is rare. What happens before? Ask about changing or stopping: Any medicines you take. Any vitamins, herbs, or supplements you take. Your provider may tell you to take pain medicines you can buy at the store before the procedure. You may have tests for: Pregnancy. You may have a pee (urine) or blood sample taken. STIs. Placing an IUD can make an infection worse. To check for cervical cancer. You may have a Pap test, which is when cells from your cervix are removed for testing. What happens during an IUD insertion? A tool, called a speculum, will be placed in your  vagina and widened so that your provider can see your cervix. A medicine to clean your cervix may be used to help lower your risk of infection. You may be given medicine to numb your cervix. This medicine is usually given by an injection into your cervix. A tool will be put into your uterus to check the length of your uterus. A thin tube that holds the IUD will be put into your vagina, through the opening of your cervix, and into your uterus. The IUD will be placed in your uterus. The tube that holds the IUD will be removed. The strings that are attached to the IUD will be trimmed so that they sit just outside your cervix. The speculum will be removed. These steps may vary. Ask what you can expect. What happens after? You may have: Bleeding. It can vary from light bleeding or spotting for a few days to period-like bleeding. This is normal. Cramps and pain in your belly. Dizziness or light-headedness. Pain in your lower back. Headaches and the feeling like you may throw up Follow these instructions at home: Do not have sex or put anything into your vagina for 24 hours after the IUD is placed. Before having sex, check to make sure that you can feel the IUD string or strings. You should be able to feel the end of the string below the opening of your cervix. If your IUD string is in place, you may continue with sex. If you had a hormonal IUD put in more than 7 days after your most recent period  started, you will need to use a backup method of birth control for 7 days after the IUD was placed. Hormones are chemicals that affect how the body works. Check that the IUD is still in place by feeling for the strings after every period, or check once a month. Use a condom every time you have sex to prevent STIs. An IUD won't protect you from STIs. Take your medicines only as told. Contact a health care provider if: You have any of the following problems with your IUD string or strings: The string  bothers or hurts you or your sexual partner. You can't feel the string. The string has gotten longer. The IUD comes out or you can feel the IUD in your vagina. You think you may be pregnant, or you miss your period. You think you may have an STI. You have bad-smelling discharge from your vagina. You have a fever and chills. You have pain during sex. Get help right away if: You have heavy bleeding, which means soaking more than 2 pads per hour for 2 hours in a row. You have sudden, really bad belly pain. This information is not intended to replace advice given to you by your health care provider. Make sure you discuss any questions you have with your health care provider. Document Revised: 02/07/2023 Document Reviewed: 02/07/2023 Elsevier Patient Education  2024 [employer].

## 2023-09-12 NOTE — Progress Notes (Signed)
 GYNECOLOGY  VISIT   HPI: 48 y.o.   Divorced  Caucasian female   G1P0001 with No LMP recorded. (Menstrual status: IUD).   here for: IUD exchange.  Has Mirena IUD and would like a new Mirena IUD.   Her IUD will expire in June.   Her IUD strings are short and her IUD intrauterine position has been confirmed by pelvic ultrasound.   Patient is asking for a refill of Clobetasol ointment.   Moving to [place], [place].  She works for the [employer].   UPT:  negative.   GYNECOLOGIC HISTORY: No LMP recorded. (Menstrual status: IUD). 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  Her endocervical button was normal, and her ECC was also normal and free of dysplasia. 02-20-21 colpo CIN II-III. 01-23-20 Neg:Pos HR HPV. 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   Class 2 severe obesity with serious comorbidity and body mass index (BMI) of 37.0 to 37.9 in adult (HCC) 09/28/2021   Hyperlipidemia 01/15/2020   Obesity (BMI 30-39.9) 01/15/2020   MDD (major depressive disorder) 02/21/2019   GAD (generalized anxiety disorder) 02/21/2019    Past Medical History:  Diagnosis Date   Alcohol abuse    Anxiety    Chicken pox    Depression    Elevated  cholesterol 2020   Fatty liver    GERD (gastroesophageal reflux disease) 2023   High cholesterol    HPV in female    Lichen sclerosus 2021   Severe dysplasia of cervix (CIN III) 2021   SOB (shortness of breath)    Substance abuse (HCC)     Past Surgical History:  Procedure Laterality Date   CLAVICLE SURGERY     FRACTURE SURGERY  2010   GYNECOLOGIC CRYOSURGERY     LEEP  03/11/2020   CIN II, margins positive for CIN I   meniscus right      Current Outpatient Medications  Medication Sig Dispense Refill   ARIPiprazole (ABILIFY) 15 MG tablet Take one tablet daily. 90 tablet 3   buPROPion (WELLBUTRIN XL) 150 MG 24 hr tablet Take 1 tablet (150 mg total) by mouth daily. 90 tablet 3   buPROPion (WELLBUTRIN XL) 300 MG 24 hr tablet TAKE 1 TABLET BY MOUTH EVERY MORNING 90 tablet 3   clobetasol ointment (TEMOVATE) 0.05 % Apply to area in a thin layer bid for 2 weeks as needed.  May apply once daily at night time, twice a week  for maintenance dose. 30 g 1   escitalopram (LEXAPRO) 10 MG tablet Take 1 tablet (10 mg total) by mouth daily. 90 tablet 3   lamoTRIgine (LAMICTAL) 150 MG tablet Take 1 tablet (150 mg total) by mouth daily. 90 tablet 3   lovastatin (MEVACOR) 40 MG tablet TAKE 1 TABLET BY MOUTH EVERYDAY AT BEDTIME 90 tablet 0   No current facility-administered medications for this visit.     ALLERGIES: Patient has no known allergies.  Family History  Problem Relation Age of Onset   Hypertension Mother    Colon polyps Mother    Depression Mother    Lupus Maternal Grandmother    Colon cancer Paternal Grandfather    Stomach cancer Neg Hx    Esophageal cancer Neg Hx    Breast cancer Neg Hx     Social History   Socioeconomic History   Marital status: Divorced    Spouse name: Not on file   Number of children: 1   Years of education: Not on file   Highest education level: Bachelor's degree (e.g., BA, AB, BS)  Occupational History   Not on file  Tobacco Use   Smoking status:  Former    Current packs/day: 0.00    Types: Cigarettes    Quit date: 06/14/2008    Years since quitting: 15.2   Smokeless tobacco: Never  Vaping Use   Vaping status: Never Used  Substance and Sexual Activity   Alcohol use: Not Currently   Drug use: Never   Sexual activity: Not Currently    Birth control/protection: I.U.D.    Comment: Mirena insertion summer of 2017  Other Topics Concern   Not on file  Social History Narrative   Not on file   Social Drivers of Health   Financial Resource Strain: Low Risk  (11/18/2022)   Overall Financial Resource Strain (CARDIA)    Difficulty of Paying Living Expenses: Not hard at all  Food Insecurity: No Food Insecurity (11/18/2022)   Hunger Vital Sign    Worried About Running Out of Food in the Last Year: Never true    Ran Out of Food in the Last Year: Never true  Transportation Needs: No Transportation Needs (11/18/2022)   PRAPARE - Administrator, Civil Service (Medical): No    Lack of Transportation (Non-Medical): No  Physical Activity: Insufficiently Active (11/18/2022)   Exercise Vital Sign    Days of Exercise per Week: 3 days    Minutes of Exercise per Session: 40 min  Stress: No Stress Concern Present (11/18/2022)   [employer] of Occupational Health - Occupational Stress Questionnaire    Feeling of Stress : Only a little  Social Connections: Moderately Integrated (11/18/2022)   Social Connection and Isolation Panel [NHANES]    Frequency of Communication with Friends and Family: More than three times a week    Frequency of Social Gatherings with Friends and Family: Once a week    Attends Religious Services: More than 4 times per year    Active Member of [employer] or Organizations: Yes    Attends Engineer, structural: More than 4 times per year    Marital Status: Divorced  Catering manager Violence: Not on file    Review of Systems  All other systems reviewed and are negative.   PHYSICAL EXAMINATION:   BP 128/76  (BP Location: Left Arm, Patient Position: Sitting, Cuff Size: Large)   Pulse 83   Ht 6\' 1"  (1.854 m)   Wt (!) 306  lb (138.8 kg)   SpO2 97%   BMI 40.37 kg/m     General appearance: alert, cooperative and appears stated age   Pelvic: External genitalia:  hypopigmentation of the vulva.               Urethra:  normal appearing urethra with no masses, tenderness or lesions              Bartholins and Skenes: normal                 Vagina: normal appearing vagina with normal color and discharge, no lesions              Cervix: no lesions                Bimanual Exam:  Uterus:  normal size, contour, position, consistency, mobility, non-tender              Adnexa: no mass, fullness, tenderness    Procedure - IUD removal and reinsertion of new Mirena IUD.       Mirena IUD - lot number  [id number], expiration Feb 2027. Consent and time out for procedure were done.  Speculum placed in vagina.  Sterile prep of cervix with Hibiclens.  Paracervical block with 10 cc 1% lidocaine, lot number  [id number], exp 02/2025. Tenaculum to anterior cervical lip. IUD strings not seen.  Os finder used to dilate the cervix.   IUD removed, intact, with forceps, shown to the patient, and discarded.  Uterus sounded to 7 cm.  Mirena IUD would not pass through cervix.  Tenaculum was then placed on the posterior cervical lip.  Uterine length recheck with a sound, and measurement of 7 cm noted.  Mirena then placed without difficulty. Strings trimmed. Patient declines to see or feel them.  Bimanual exam repeated. No change.  Minimal EBL.  No complications.    Chaperone was present for exam:  Warren Lacy, CMA  ASSESSMENT:  Mirena IUD removal and reinsertion of Mirena IUD.  Lichen sclerosus.   PLAN:  IUD precautions given.  Use back up protection recommended for at least one week.  IUD check up in about 10 days.  Refill of Clobetasol ointment.

## 2023-09-22 ENCOUNTER — Ambulatory Visit: Admitting: Obstetrics and Gynecology

## 2023-09-22 VITALS — BP 132/74 | HR 79 | Ht 73.0 in | Wt 306.0 lb

## 2023-09-22 DIAGNOSIS — Z30431 Encounter for routine checking of intrauterine contraceptive device: Secondary | ICD-10-CM

## 2023-09-22 DIAGNOSIS — Z87410 Personal history of cervical dysplasia: Secondary | ICD-10-CM

## 2023-09-22 NOTE — Patient Instructions (Signed)
 Tomoko,   Best wishes for your move to [facility]!  Thank you for being my patient.   Conley Simmonds, MD

## 2023-09-22 NOTE — Progress Notes (Signed)
 GYNECOLOGY  VISIT   HPI: 48 y.o.   Divorced  Caucasian female   G1P0001 with No LMP recorded. (Menstrual status: IUD).   here for: IUD check.  Having some bleeding.  Changing three times a day.  Period like cramping.   Moving to [place] next week.  GYNECOLOGIC HISTORY: No LMP recorded. (Menstrual status: IUD). 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  Her endocervical button was normal, and her ECC was also normal and free of dysplasia. 02-20-21 colpo CIN II-III. 01-23-20 Neg:Pos HR HPV. 12-26-18 ASCUS:Pos HR HPV--colpo revealed LGSIL--no treatment, Hx of cryotherapy to cervix  Mammogram:  08/01/23 Breast Density Cat B, BI-RADS CAT 1 neg         OB History     Gravida  1   Para  1   Term  0   Preterm  0   AB  0   Living  1      SAB  0   IAB  0   Ectopic  0   Multiple  0   Live Births  0              Patient Active Problem List   Diagnosis Date Noted   Tremor 11/19/2022   Prediabetes 11/19/2022   Vitamin D insufficiency 11/17/2022   Generalized obesity 11/17/2022   BMI 37.0-37.9, adult 11/17/2022   Alcohol abuse, 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/20/2022   Class 2 severe obesity with serious comorbidity and body mass index (BMI) of 37.0 to 37.9 in adult (HCC) 09/28/2021   Hyperlipidemia 01/15/2020   Obesity (BMI 30-39.9) 01/15/2020   MDD (major depressive disorder) 02/21/2019   GAD (generalized anxiety disorder) 02/21/2019    Past Medical History:  Diagnosis Date   Alcohol abuse    Anxiety    Chicken pox    Depression    Elevated cholesterol 2020   Fatty liver    GERD (gastroesophageal reflux disease) 2023   High cholesterol    HPV in female    Lichen sclerosus 2021   Severe dysplasia of cervix (CIN III) 2021   SOB (shortness of breath)     Substance abuse (HCC)     Past Surgical History:  Procedure Laterality Date   CLAVICLE SURGERY     FRACTURE SURGERY  2010   GYNECOLOGIC CRYOSURGERY     LEEP  03/11/2020   CIN II, margins positive for CIN I   meniscus right      Current Outpatient Medications  Medication Sig Dispense Refill   ARIPiprazole (ABILIFY) 15 MG tablet Take one tablet daily. 90 tablet 3   buPROPion (WELLBUTRIN XL) 150 MG 24 hr tablet Take 1 tablet (150 mg total) by mouth daily. 90 tablet 3   buPROPion (WELLBUTRIN XL) 300 MG 24 hr tablet TAKE 1 TABLET BY MOUTH EVERY MORNING 90 tablet 3   clobetasol ointment (TEMOVATE) 0.05 % Apply to area in a thin layer bid for 2 weeks as needed.  May apply once daily at night time, twice a week for maintenance dose. 30 g 1   escitalopram (LEXAPRO) 10 MG tablet Take 1 tablet (10 mg total) by mouth daily. 90 tablet 3   lamoTRIgine (LAMICTAL) 150 MG tablet Take 1 tablet (150 mg total) by mouth daily. 90 tablet  3   lovastatin (MEVACOR) 40 MG tablet TAKE 1 TABLET BY MOUTH EVERYDAY AT BEDTIME 90 tablet 0   No current facility-administered medications for this visit.     ALLERGIES: Patient has no known allergies.  Family History  Problem Relation Age of Onset   Hypertension Mother    Colon polyps Mother    Depression Mother    Lupus Maternal Grandmother    Colon cancer Paternal Grandfather    Stomach cancer Neg Hx    Esophageal cancer Neg Hx    Breast cancer Neg Hx     Social History   Socioeconomic History   Marital status: Divorced    Spouse name: Not on file   Number of children: 1   Years of education: Not on file   Highest education level: Bachelor's degree (e.g., BA, AB, BS)  Occupational History   Not on file  Tobacco Use   Smoking status: Former    Current packs/day: 0.00    Types: Cigarettes    Quit date: 06/14/2008    Years since quitting: 15.2   Smokeless tobacco: Never  Vaping Use   Vaping status: Never Used  Substance and Sexual Activity    Alcohol use: Not Currently   Drug use: Never   Sexual activity: Not Currently    Birth control/protection: I.U.D.    Comment: Mirena insertion summer of 2017  Other Topics Concern   Not on file  Social History Narrative   Not on file   Social Drivers of Health   Financial Resource Strain: Low Risk  (11/18/2022)   Overall Financial Resource Strain (CARDIA)    Difficulty of Paying Living Expenses: Not hard at all  Food Insecurity: No Food Insecurity (11/18/2022)   Hunger Vital Sign    Worried About Running Out of Food in the Last Year: Never true    Ran Out of Food in the Last Year: Never true  Transportation Needs: No Transportation Needs (11/18/2022)   PRAPARE - Administrator, Civil Service (Medical): No    Lack of Transportation (Non-Medical): No  Physical Activity: Insufficiently Active (11/18/2022)   Exercise Vital Sign    Days of Exercise per Week: 3 days    Minutes of Exercise per Session: 40 min  Stress: No Stress Concern Present (11/18/2022)   [employer] of Occupational Health - Occupational Stress Questionnaire    Feeling of Stress : Only a little  Social Connections: Moderately Integrated (11/18/2022)   Social Connection and Isolation Panel [NHANES]    Frequency of Communication with Friends and Family: More than three times a week    Frequency of Social Gatherings with Friends and Family: Once a week    Attends Religious Services: More than 4 times per year    Active Member of [employer] or Organizations: Yes    Attends Engineer, structural: More than 4 times per year    Marital Status: Divorced  Catering manager Violence: Not on file    Review of Systems  All other systems reviewed and are negative.   PHYSICAL EXAMINATION:   BP 132/74 (BP Location: Left Arm, Patient Position: Sitting, Cuff Size: Large)   Pulse 79   Ht 6\' 1"  (1.854 m)   Wt (!) 306 lb (138.8 kg)   SpO2 98%   BMI 40.37 kg/m     General appearance: alert, cooperative and appears  stated age   Pelvic: External genitalia:  no lesions  Urethra:  normal appearing urethra with no masses, tenderness or lesions              Bartholins and Skenes: normal                 Vagina: normal appearing vagina with normal color and discharge, no lesions              Cervix: no lesions.  IUD threads noted, 2 cm of length.                 Bimanual Exam:  Uterus:  normal size, contour, position, consistency, mobility, non-tender              Adnexa: no mass, fullness, tenderness           Chaperone was present for exam:  Warren Lacy, CMA  ASSESSMENT:  IUD check up.  Doing well.  Status post LEEP.   Final pathology CIN II.    PLAN:  Bleeding profiles with Mirena discussed.  IUD effective for 8 years.  Next pap and HR HPV testing in 2027.  Medical records are available by request or through Epic record sharing.  FU prn.

## 2023-09-28 ENCOUNTER — Other Ambulatory Visit: Payer: Self-pay | Admitting: Family Medicine

## 2023-09-28 DIAGNOSIS — E785 Hyperlipidemia, unspecified: Secondary | ICD-10-CM

## 2023-10-05 IMAGING — MG MM DIGITAL SCREENING BILAT W/ TOMO AND CAD
6 of 10 series · 6 of 30 positions shown · non-contrast
Comparison: Previous exam(s).

CLINICAL DATA: Screening.

EXAM:
DIGITAL SCREENING BILATERAL MAMMOGRAM WITH TOMOSYNTHESIS AND CAD
TECHNIQUE: Bilateral screening digital craniocaudal and mediolateral oblique
mammograms were obtained. Bilateral screening digital breast
tomosynthesis was performed. The images were evaluated with
computer-aided detection.

[R MLO synth-2D]
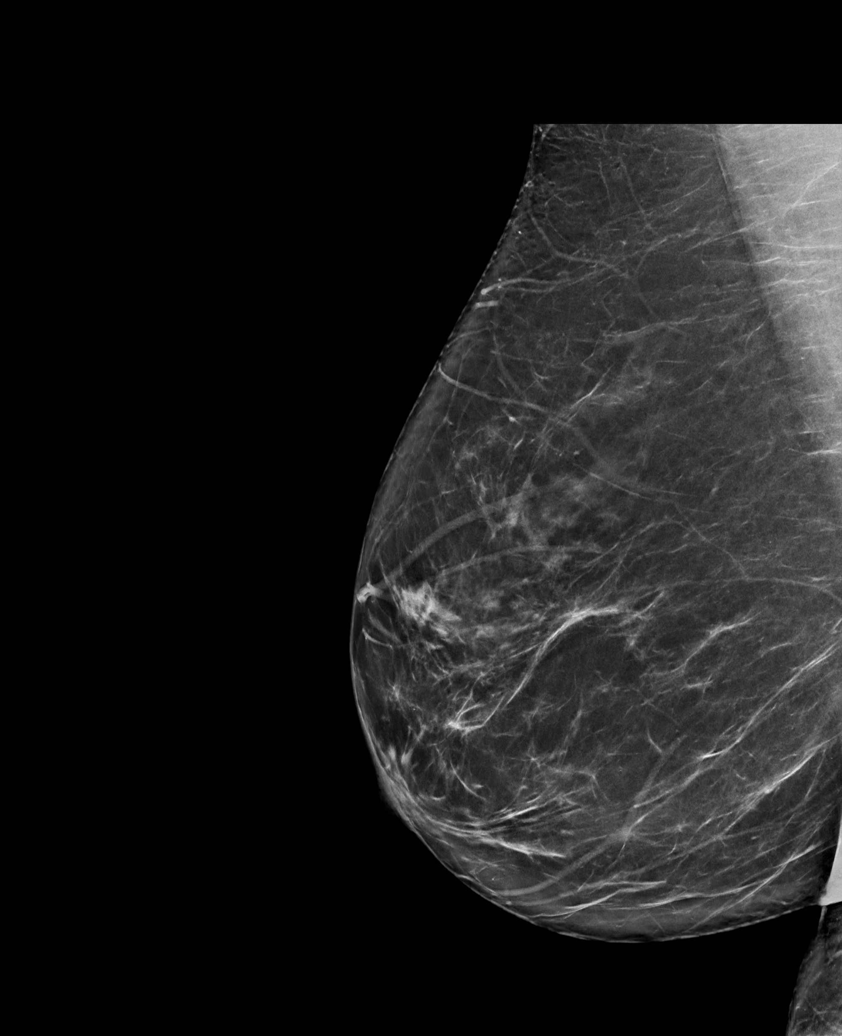

[L CC synth-2D (1 of 2)]
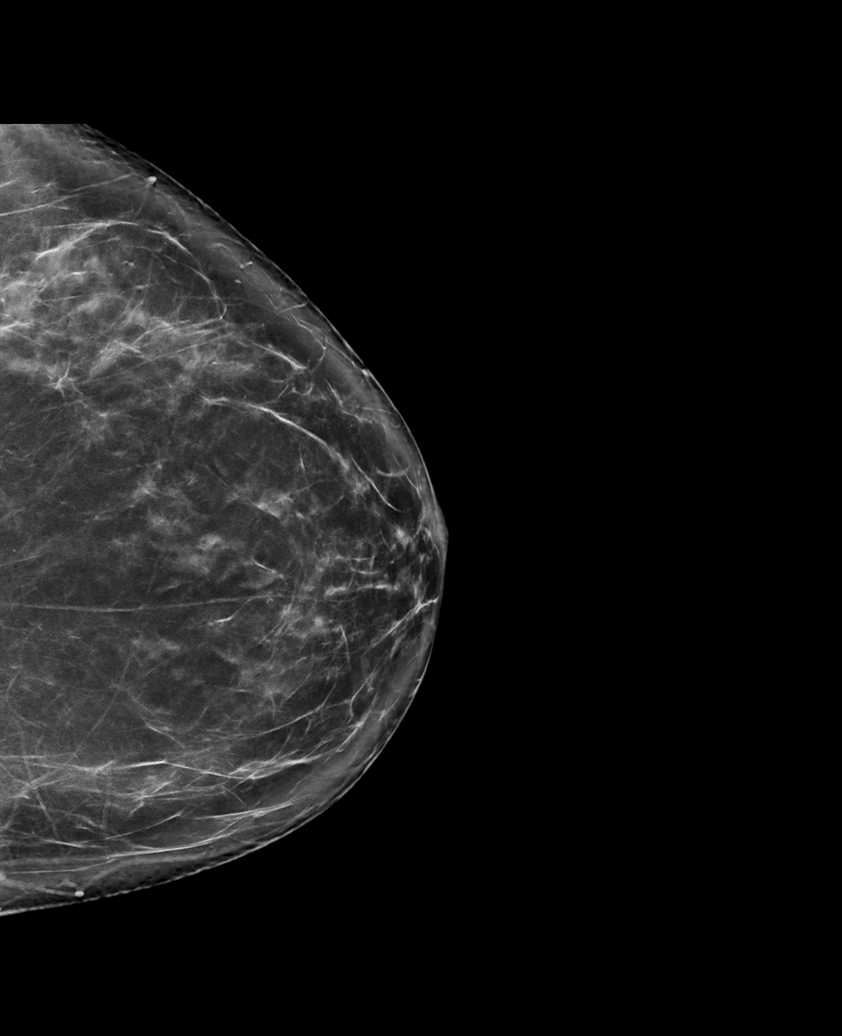

[L CC synth-2D (2 of 2)]
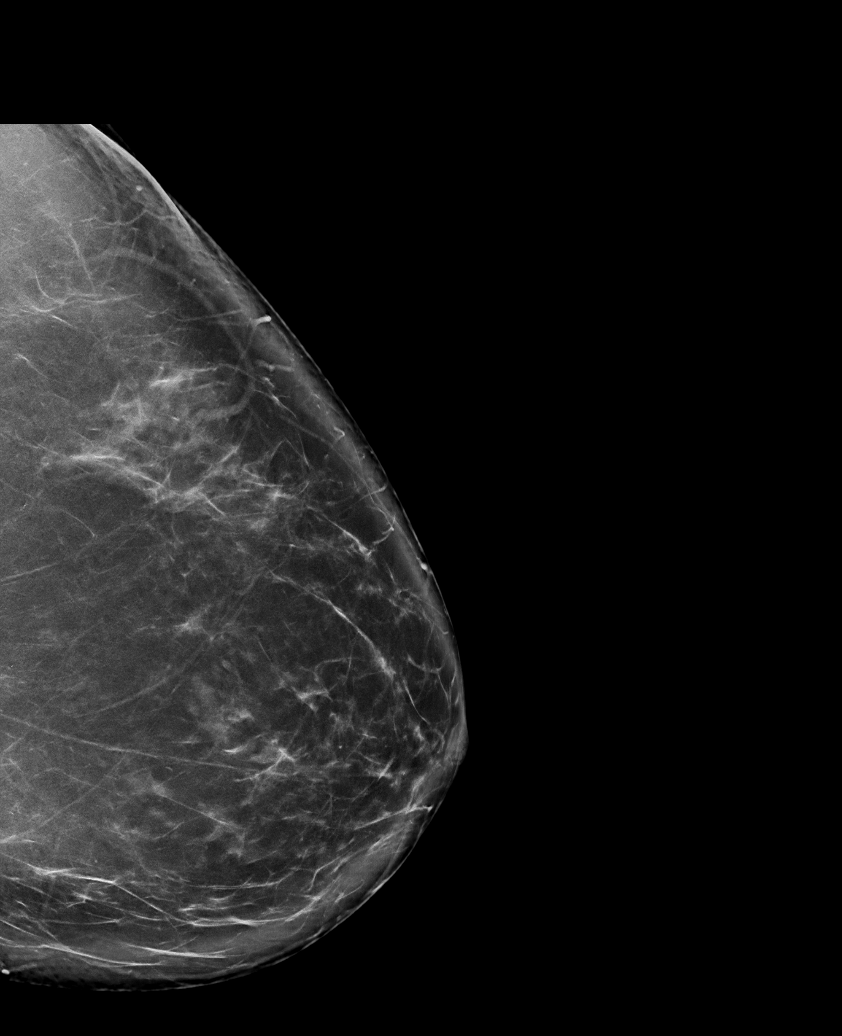

[R CC synth-2D]
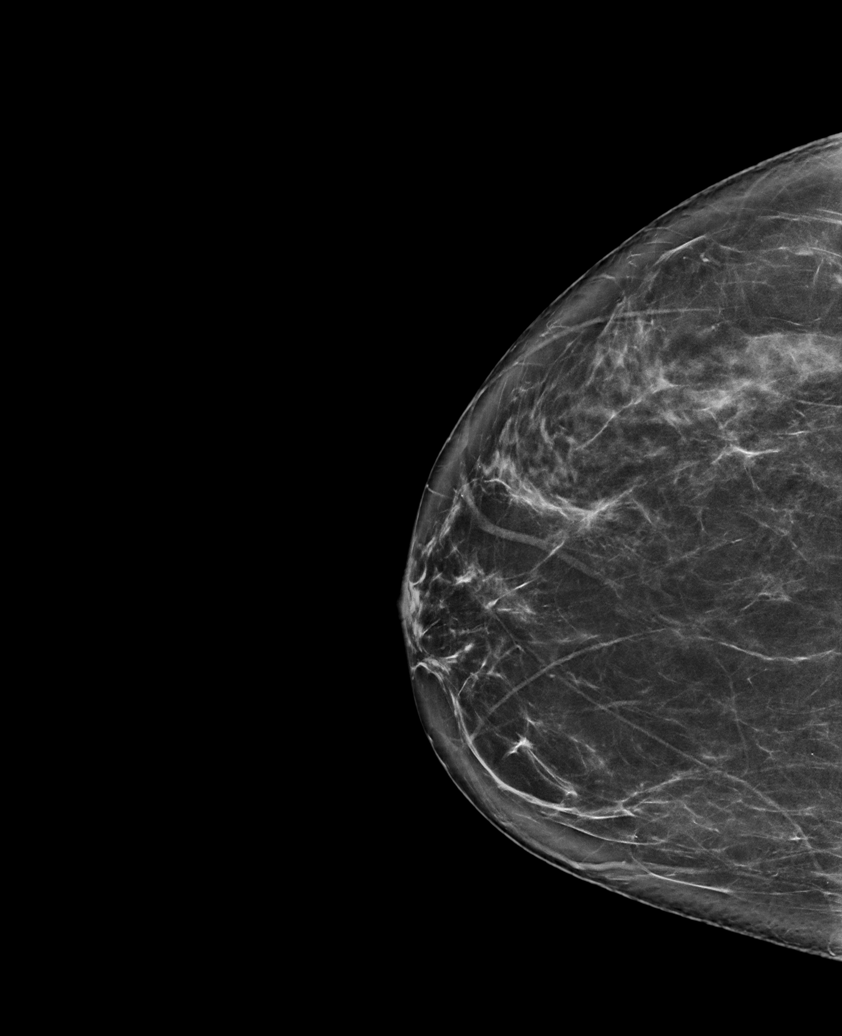

[L MLO synth-2D]
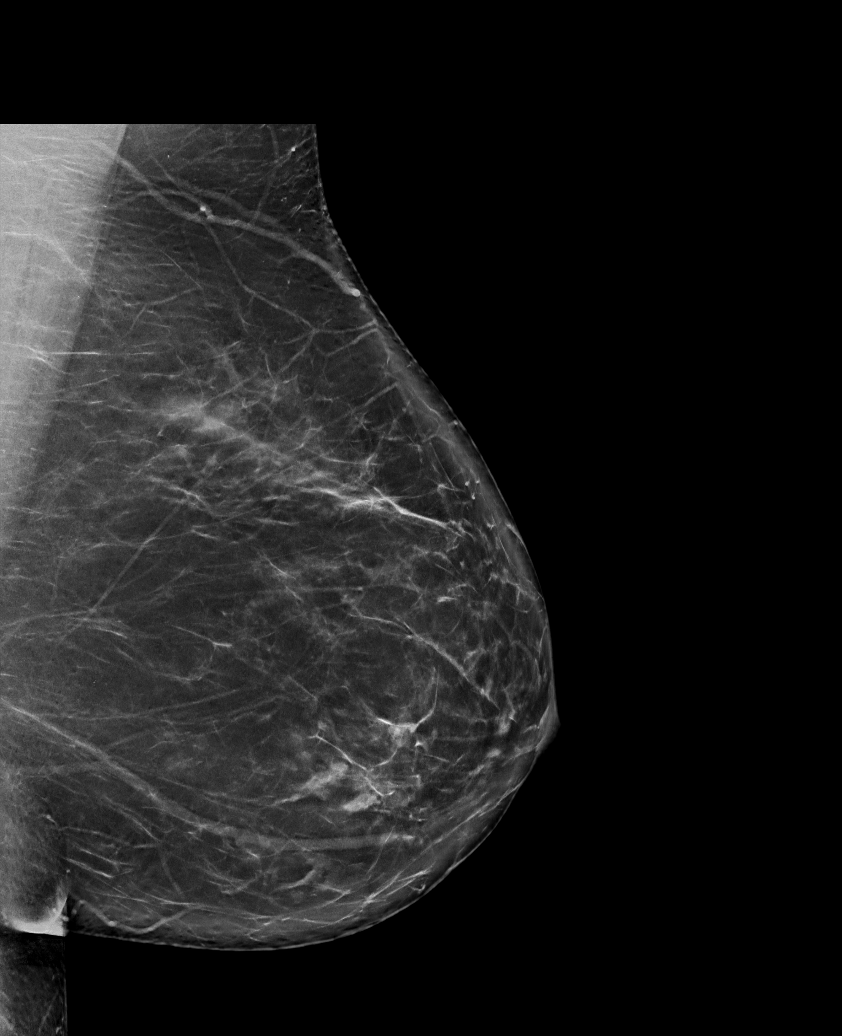

[L CC tomo · tomo slice 45/90.0]
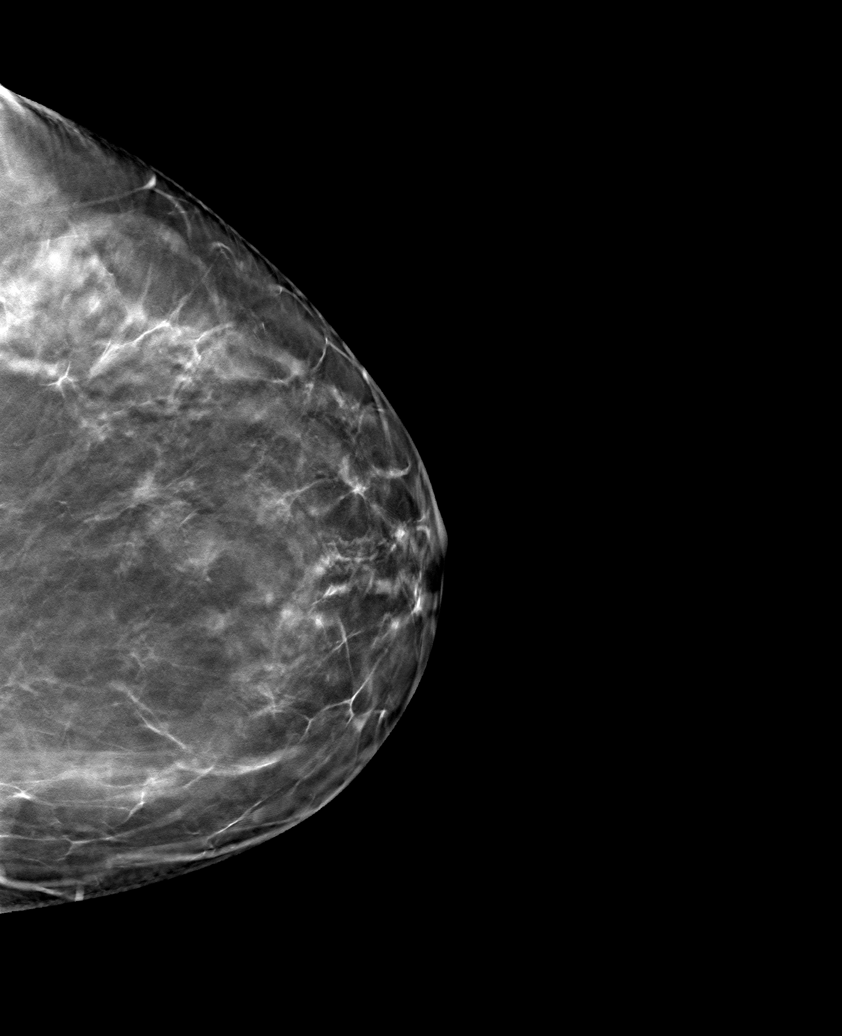

[6 of 30 positions shown; findings below may reference images not displayed]

ACR Breast Density Category b: There are scattered areas of
fibroglandular density.
FINDINGS: There are no findings suspicious for malignancy.
IMPRESSION: No mammographic evidence of malignancy. A result letter of this
screening mammogram will be mailed directly to the patient.

RECOMMENDATION:
Screening mammogram in one year. (Code:51-O-LD2)

BI-RADS CATEGORY  1: Negative.

## 2023-10-07 ENCOUNTER — Other Ambulatory Visit: Payer: Self-pay | Admitting: Adult Health

## 2023-10-07 DIAGNOSIS — F331 Major depressive disorder, recurrent, moderate: Secondary | ICD-10-CM

## 2023-10-07 DIAGNOSIS — F411 Generalized anxiety disorder: Secondary | ICD-10-CM

## 2023-10-11 ENCOUNTER — Telehealth: Payer: Self-pay | Admitting: Adult Health

## 2023-10-11 NOTE — Telephone Encounter (Signed)
 Pt called at 4:30p requesting refill of Wellbutrin  150mg  and 300mg .  She is about out.  She has moved to [place]  and hasn't found a doctor there yet.  She will need the other meds filled also, but the Wellbutrin  is the highest priority.   Send to [facility] [phone] [place]  [address]

## 2023-10-11 NOTE — Telephone Encounter (Signed)
 LV 10/01/22. A years supply of all her medications were sent at that time. LVM per DPR that we could not refill her medications.

## 2023-10-27 ENCOUNTER — Other Ambulatory Visit: Payer: Self-pay | Admitting: Adult Health

## 2024-02-27 ENCOUNTER — Telehealth: Payer: Self-pay | Admitting: Neurology

## 2024-02-27 NOTE — Telephone Encounter (Signed)
 Pt called to cancel appt due to moving out of town   Appt Canceled

## 2024-02-28 ENCOUNTER — Ambulatory Visit: Payer: Federal, State, Local not specified - PPO | Admitting: Neurology

## 2024-04-09 ENCOUNTER — Encounter: Payer: Self-pay | Admitting: Obstetrics and Gynecology
